# Patient Record
Sex: Female | Born: 1980 | Hispanic: No | Marital: Married | Smoking: Never smoker
Health system: Southern US, Community
[De-identification: ages and names within clinical notes are randomized; demographics above are authoritative.]

## PROBLEM LIST (undated history)

## (undated) DIAGNOSIS — R519 Headache, unspecified: Secondary | ICD-10-CM

## (undated) DIAGNOSIS — K635 Polyp of colon: Secondary | ICD-10-CM

## (undated) HISTORY — DX: Headache, unspecified: R51.9

## (undated) HISTORY — PX: TUBAL LIGATION: SHX77

## (undated) HISTORY — PX: DILATION AND CURETTAGE OF UTERUS: SHX78

## (undated) HISTORY — DX: Polyp of colon: K63.5

---
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
## 2005-04-22 DIAGNOSIS — C539 Malignant neoplasm of cervix uteri, unspecified: Secondary | ICD-10-CM

## 2005-04-22 HISTORY — DX: Malignant neoplasm of cervix uteri, unspecified: C53.9

## 2015-01-19 LAB — HIV ANTIBODY (ROUTINE TESTING W REFLEX): HIV 1&2 Ab, 4th Generation: NONREACTIVE

## 2015-12-20 LAB — HM PAP SMEAR: HM Pap smear: NEGATIVE

## 2019-11-29 ENCOUNTER — Telehealth: Payer: Self-pay

## 2019-11-29 NOTE — Telephone Encounter (Signed)
Copied from [place] [phone]. Topic: General - Other >> Nov 29, 2019  9:04 AM Keene Breath wrote: Reason for CRM: Patient is returning a call to reschedule her appt.  There was nothing available for over a year.  Patient understands but would like to be put on a schedule with a doctor for NP since it was not her fault that it was cancelled.  Please call to discuss [phone]

## 2019-11-30 ENCOUNTER — Ambulatory Visit: Payer: Self-pay | Admitting: Adult Health

## 2019-11-30 NOTE — Telephone Encounter (Signed)
Unable to reschedule appointment can we contact patient to reschedule?

## 2019-12-24 ENCOUNTER — Ambulatory Visit: Payer: Self-pay | Admitting: Adult Health

## 2020-01-07 ENCOUNTER — Ambulatory Visit: Payer: Self-pay | Admitting: Adult Health

## 2020-01-28 ENCOUNTER — Ambulatory Visit: Payer: Self-pay | Admitting: Adult Health

## 2020-02-09 ENCOUNTER — Encounter: Payer: Self-pay | Admitting: Family Medicine

## 2020-02-09 ENCOUNTER — Other Ambulatory Visit: Payer: Self-pay

## 2020-02-09 ENCOUNTER — Ambulatory Visit (INDEPENDENT_AMBULATORY_CARE_PROVIDER_SITE_OTHER): Payer: 59 | Admitting: Family Medicine

## 2020-02-09 ENCOUNTER — Telehealth: Payer: Self-pay | Admitting: Family Medicine

## 2020-02-09 VITALS — BP 98/72 | HR 68 | Temp 97.8°F | Ht 60.5 in | Wt 200.0 lb

## 2020-02-09 DIAGNOSIS — E782 Mixed hyperlipidemia: Secondary | ICD-10-CM

## 2020-02-09 DIAGNOSIS — E669 Obesity, unspecified: Secondary | ICD-10-CM

## 2020-02-09 DIAGNOSIS — Z1159 Encounter for screening for other viral diseases: Secondary | ICD-10-CM | POA: Diagnosis not present

## 2020-02-09 DIAGNOSIS — R41840 Attention and concentration deficit: Secondary | ICD-10-CM | POA: Diagnosis not present

## 2020-02-09 DIAGNOSIS — E785 Hyperlipidemia, unspecified: Secondary | ICD-10-CM | POA: Insufficient documentation

## 2020-02-09 LAB — CBC
HCT: 37.3 % (ref 36.0–46.0)
Hemoglobin: 12.4 g/dL (ref 12.0–15.0)
MCHC: 33.3 g/dL (ref 30.0–36.0)
MCV: 82.1 fl (ref 78.0–100.0)
Platelets: 367 10*3/uL (ref 150.0–400.0)
RBC: 4.54 Mil/uL (ref 3.87–5.11)
RDW: 14 % (ref 11.5–15.5)
WBC: 9.9 10*3/uL (ref 4.0–10.5)

## 2020-02-09 LAB — COMPREHENSIVE METABOLIC PANEL
ALT: 14 U/L (ref 0–35)
AST: 18 U/L (ref 0–37)
Albumin: 4.1 g/dL (ref 3.5–5.2)
Alkaline Phosphatase: 78 U/L (ref 39–117)
BUN: 11 mg/dL (ref 6–23)
CO2: 30 mEq/L (ref 19–32)
Calcium: 9.6 mg/dL (ref 8.4–10.5)
Chloride: 101 mEq/L (ref 96–112)
Creatinine, Ser: 0.77 mg/dL (ref 0.40–1.20)
GFR: 98.37 mL/min (ref >60.00)
Glucose, Bld: 82 mg/dL (ref 70–99)
Potassium: 4.2 mEq/L (ref 3.5–5.1)
Sodium: 135 mEq/L (ref 135–145)
Total Bilirubin: 0.5 mg/dL (ref 0.2–1.2)
Total Protein: 7.6 g/dL (ref 6.0–8.3)

## 2020-02-09 LAB — LIPID PANEL
Cholesterol: 161 mg/dL (ref 0–200)
HDL: 48.1 mg/dL (ref >39.00)
LDL Cholesterol: 80 mg/dL (ref 0–99)
NonHDL: 113.38
Total CHOL/HDL Ratio: 3
Triglycerides: 166 mg/dL — ABNORMAL HIGH (ref 0.0–149.0)
VLDL: 33.2 mg/dL (ref 0.0–40.0)

## 2020-02-09 NOTE — Assessment & Plan Note (Signed)
Working on lifestyle, recheck labs

## 2020-02-09 NOTE — Telephone Encounter (Signed)
Pt called stated that the referral for the [facility] in [place] and they are not available until June and wanted to know if she can get someone in [place]. Please advise.

## 2020-02-09 NOTE — Patient Instructions (Signed)
Strategies that help with ADHD  1) To-Do List - Make a daily list of tasks you want to accomplish - break large tasks down into smaller ones that you can do with your attention span - Categorize tasks using ABC system:  ---"A" tasks are of most importance and need to be done more immediately ---"B" tasks may be important, but can wait ---"C" tasks are often easiest, but should not be done first  2) Time Management: set a timer for completing certain tasks. Consider using this to monitor breaks between tasks  3) Delaying Distractibility: When trying to focus on a task, consider having a small notebook to jot down ideas that interrupt concentration. This way you don't forget something and can redirect attention to the immediate task  4) Mindfulness: Try to do only one thing at a time. Multitasking often leads to mistakes and distractions.   5) Minimize Distractions: Limit potential distractions while you are trying to complete tasks. Quiet place, no background noise, consider turning off your phone.   6) Visual Reminders: Use sticky notes, white board, or small notebook to remind yourself of tasks -- and keep in ONE obvious place  7) Reminders/alerts: Set timers or alerts to reminder yourself of appointments.   8) Calendar/planner: Keep a calendar to maintain your schedule as well as manage your time. Put all deadlines, appointments, etc in the planner and schedule blocks of time for projects at home (exercising, and other goals). Make "appointments" with that time to protect that time for those tasks specifically.   Self-help Materials 1) Resources:  -- Patent attorney but Scattered Guide to Success: How to Use Your Brains' Executive Skills to Keep Up, Stay Calm, and Get Organized at Work and at Home by Renato Battles and [facility]: Treatment Options and Coping Strategies for Adult Attention Deficit Disorder by Virgie Dad and Benjie Karvonen -- Driven to Distraction (Revised): Recognizing  and Coping with Attention Deficit Disorder by Alethia Berthold and Zigmund Gottron -- Taking Charge of Adult ADHD by Murlean Hark -- www.chadd.org and www.http://gonzalez-rivas.net/  2) Deep breathing - can be done in 3-5 minutes throughout the day.  -- Take a deep breath in through the nose (for 4 seconds), noticing yoru stomach expands as you inhale and take along breath out (for 6 seconds) noticing that your stomach deflates as you exhale. Apps like Calm and Breath2Relax provide short, guided breathing exercises.   3) Mindfulness: The goal is to concentrate on the present moment in a descriptive and non-judgmental manner -- Books: The Mindfulness Prescription for Adult ADHD: An 8-Step Program for Strengthening Your Attention, Managing Emotions, and Achieving Your Goals by Bari Edward -- Apps: Insight Timer, Headspace, and Buddhify2 -- Websites: www.mindful.org and franticworld.com/free-meditations-from-mindfulness and YouTube videos by Lynn Ito such as "Three Minute Breathing Space" -- Yoga practice   Healthy Habits 1) Try to maintain a healthy lifestyle -- eating lots of fruits and veggies and getting regular exercise  2) Good Sleep Hygiene Habits -- Got to bed and wake up within an hour of the same time every day -- Avoid bright screens (from laptop, phone, TV) within at least 30 minutes before bed. The "blue light" supresses the sleep hormone melatonin and the content may stimulate as well -- Avoid heavy foods and caffeine within at least 2 hours of bedtime -- Maintain a quiet and dark sleep environment (blackout curtains, turn on a fan or white noise to block out disruptive sounds) -- Practicing relaxing activites before bed (taking  a shower, reading a book, journaling, meditation app) -- To quiet a busy mid -- consider journaling before bed (jotting down reminders, worry thoughts, as well as positive things like a gratitude list)

## 2020-02-09 NOTE — Assessment & Plan Note (Signed)
Weight down 100 lbs with regular exercise and diet. Continue to work on healthy lifestyle and labs today to assess.

## 2020-02-09 NOTE — Assessment & Plan Note (Signed)
Discussed this could be related to anxiety/depression or normal variation given having lots of responsibility. Pt is interested in pursuing evaluation with psychology, referral placed. Also given handout of non-medication approaches.

## 2020-02-09 NOTE — Telephone Encounter (Signed)
Routing to referral coordinator to update referral location for [place]. Needs ADHD assessment.

## 2020-02-09 NOTE — Progress Notes (Signed)
Subjective:     Meghan Lewis is a 39 y.o. female presenting for Establish Care and ADHD (never been diagnosed, but thinks she has it )     HPI  #ADHD - taking ashwagandha w/ some improvement in mood - is getting irritable if she is talking and interrupted because she loses her train of thought and gets upset - husband notices that she is talking non-stop - difficulty keeping her focus  - will start one task and then go to another room and get distracted by something else - will start a kettle for tea and forget about it   Was initially having some worry/anxiety  Would worry about not getting tasks done  Will go down a rabbit whole of what could happen  Denies depression symptoms   Did not get good grades in school Had difficulty focusing in school -- middle and high were difficult  At last job was able to switch between tasks easily and did well in a busy office environment    Review of Systems   Social History   Tobacco Use  Smoking Status Never Smoker  Smokeless Tobacco Never Used        Objective:    BP Readings from Last 3 Encounters:  02/09/20 98/72   Wt Readings from Last 3 Encounters:  02/09/20 200 lb (90.7 kg)    BP 98/72   Pulse 68   Temp 97.8 F (36.6 C) (Temporal)   Ht 5' 0.5" (1.537 m)   Wt 200 lb (90.7 kg)   LMP 01/30/2020 (Exact Date)   SpO2 97%   BMI 38.42 kg/m    Physical Exam Constitutional:      General: She is not in acute distress.    Appearance: She is well-developed. She is not diaphoretic.  HENT:     Right Ear: External ear normal.     Left Ear: External ear normal.     Nose: Nose normal.  Eyes:     Conjunctiva/sclera: Conjunctivae normal.  Cardiovascular:     Rate and Rhythm: Normal rate.  Pulmonary:     Effort: Pulmonary effort is normal.  Musculoskeletal:     Cervical back: Neck supple.  Skin:    General: Skin is warm and dry.     Capillary Refill: Capillary refill takes less than 2 seconds.    Neurological:     Mental Status: She is alert. Mental status is at baseline.  Psychiatric:        Mood and Affect: Mood normal.        Behavior: Behavior normal.           Assessment & Plan:   Problem List Items Addressed This Visit      Other   Hyperlipidemia    Working on lifestyle, recheck labs      Relevant Orders   Lipid panel   Obesity (BMI 35.0-39.9 without comorbidity)    Weight down 100 lbs with regular exercise and diet. Continue to work on healthy lifestyle and labs today to assess.       Relevant Orders   Comprehensive metabolic panel   Lipid panel   CBC   Attention deficit - Primary    Discussed this could be related to anxiety/depression or normal variation given having lots of responsibility. Pt is interested in pursuing evaluation with psychology, referral placed. Also given handout of non-medication approaches.       Relevant Orders   Ambulatory referral to Psychology    Other Visit Diagnoses  Need for hepatitis C screening test       Relevant Orders   Hepatitis C antibody       Return in about 6 months (around 08/09/2020).  Lesleigh Noe, MD  This visit occurred during the SARS-CoV-2 public health emergency.  Safety protocols were in place, including screening questions prior to the visit, additional usage of staff PPE, and extensive cleaning of exam room while observing appropriate contact time as indicated for disinfecting solutions.

## 2020-02-10 LAB — HEPATITIS C ANTIBODY
Hepatitis C Ab: NONREACTIVE
SIGNAL TO CUT-OFF: 0.01 (ref <1.00)

## 2020-02-11 NOTE — Telephone Encounter (Signed)
Sent to [place] - placed on their work que and requested a call if unable to schedule the patient within in the next few weeks.

## 2020-02-24 NOTE — Telephone Encounter (Signed)
These are some options for the patient ... [facility] for patient to return call to discuss further   [facility] [phone]  [facility] [place] [facility] [phone]  [employer] [phone]  [employer] [phone]

## 2020-03-15 ENCOUNTER — Encounter: Payer: Self-pay | Admitting: Family Medicine

## 2020-04-19 ENCOUNTER — Encounter: Payer: Self-pay | Admitting: *Deleted

## 2020-04-19 NOTE — Telephone Encounter (Signed)
Letter mailed to contact the office

## 2020-08-09 ENCOUNTER — Ambulatory Visit (INDEPENDENT_AMBULATORY_CARE_PROVIDER_SITE_OTHER): Payer: 59 | Admitting: Family Medicine

## 2020-08-09 ENCOUNTER — Encounter: Payer: Self-pay | Admitting: Family Medicine

## 2020-08-09 ENCOUNTER — Other Ambulatory Visit: Payer: Self-pay

## 2020-08-09 VITALS — BP 130/80 | HR 89 | Temp 98.0°F | Ht 61.0 in | Wt 205.5 lb

## 2020-08-09 DIAGNOSIS — R41840 Attention and concentration deficit: Secondary | ICD-10-CM

## 2020-08-09 DIAGNOSIS — Z8541 Personal history of malignant neoplasm of cervix uteri: Secondary | ICD-10-CM | POA: Diagnosis not present

## 2020-08-09 NOTE — Progress Notes (Signed)
   Subjective:     Meghan Lewis is a 40 y.o. 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 job - thinks she was just depressed and this was causing her focus issues  - on vacation currently -    Review of Systems   Social History   Tobacco Use  Smoking Status Never Smoker  Smokeless Tobacco Never Used        Objective:    BP Readings from Last 3 Encounters:  08/09/20 130/80  02/09/20 98/72   Wt Readings from Last 3 Encounters:  08/09/20 205 lb 8 oz (93.2 kg)  02/09/20 200 lb (90.7 kg)    BP 130/80   Pulse 89   Temp 98 F (36.7 C) (Temporal)   Ht 5\' 1"  (1.549 m)   Wt 205 lb 8 oz (93.2 kg)   LMP 07/24/2020 (Exact Date)   SpO2 99%   BMI 38.83 kg/m    Physical Exam Constitutional:      General: She is not in acute distress.    Appearance: She is well-developed. She is not diaphoretic.  HENT:     Right Ear: External ear normal.     Left Ear: External ear normal.  Eyes:     Conjunctiva/sclera: Conjunctivae normal.  Cardiovascular:     Rate and Rhythm: Normal rate.  Pulmonary:     Effort: Pulmonary effort is normal.  Musculoskeletal:     Cervical back: Neck supple.  Skin:    General: Skin is warm and dry.     Capillary Refill: Capillary refill takes less than 2 seconds.  Neurological:     Mental Status: She is alert. Mental status is at baseline.  Psychiatric:        Mood and Affect: Mood normal.        Behavior: Behavior normal.           Assessment & Plan:   Problem List Items Addressed This Visit      Other   Attention deficit -  Primary    Symptoms resolved. Doing well at her job.       History of cervical cancer    Discussed need for continued routine screening. She will return in August for annual with pap.           Return in about 4 months (around 12/09/2020) for annual physical.  Lesleigh Noe, MD  This visit occurred during the SARS-CoV-2 public health emergency.  Safety protocols were in place, including screening questions prior to the visit, additional usage of staff PPE, and extensive cleaning of exam room while observing appropriate contact time as indicated for disinfecting solutions.

## 2020-08-09 NOTE — Assessment & Plan Note (Signed)
Symptoms resolved. Doing well at her job.

## 2020-08-09 NOTE — Patient Instructions (Signed)
YAY! Glad you are doing better  Return in 4 months for annual

## 2020-08-09 NOTE — Assessment & Plan Note (Signed)
Discussed need for continued routine screening. She will return in August for annual with pap.

## 2020-12-11 ENCOUNTER — Other Ambulatory Visit (HOSPITAL_COMMUNITY)
Admission: RE | Admit: 2020-12-11 | Discharge: 2020-12-11 | Disposition: A | Discharge status: Home / Self Care | Payer: 59 | Source: Ambulatory Visit | Attending: Family Medicine | Admitting: Family Medicine

## 2020-12-11 ENCOUNTER — Other Ambulatory Visit: Payer: Self-pay

## 2020-12-11 ENCOUNTER — Ambulatory Visit (INDEPENDENT_AMBULATORY_CARE_PROVIDER_SITE_OTHER): Payer: 59 | Admitting: Family Medicine

## 2020-12-11 ENCOUNTER — Encounter: Payer: Self-pay | Admitting: Family Medicine

## 2020-12-11 VITALS — BP 118/64 | HR 58 | Temp 98.6°F | Resp 15 | Ht 61.0 in | Wt 212.8 lb

## 2020-12-11 DIAGNOSIS — Z124 Encounter for screening for malignant neoplasm of cervix: Secondary | ICD-10-CM

## 2020-12-11 DIAGNOSIS — Z0000 Encounter for general adult medical examination without abnormal findings: Secondary | ICD-10-CM

## 2020-12-11 NOTE — Patient Instructions (Signed)
Preventive Care 21-39 Years Old, Female Preventive care refers to lifestyle choices and visits with your health care provider that can promote health and wellness. This includes: A yearly physical exam. This is also called an annual wellness visit. Regular dental and eye exams. Immunizations. Screening for certain conditions. Healthy lifestyle choices, such as: Eating a healthy diet. Getting regular exercise. Not using drugs or products that contain nicotine and tobacco. Limiting alcohol use. What can I expect for my preventive care visit? Physical exam Your health care provider may check your: Height and weight. These may be used to calculate your BMI (body mass index). BMI is a measurement that tells if you are at a healthy weight. Heart rate and blood pressure. Body temperature. Skin for abnormal spots. Counseling Your health care provider may ask you questions about your: Past medical problems. Family's medical history. Alcohol, tobacco, and drug use. Emotional well-being. Home life and relationship well-being. Sexual activity. Diet, exercise, and sleep habits. Work and work environment. Access to firearms. Method of birth control. Menstrual cycle. Pregnancy history. What immunizations do I need?  Vaccines are usually given at various ages, according to a schedule. Your health care provider will recommend vaccines for you based on your age, medicalhistory, and lifestyle or other factors, such as travel or where you work. What tests do I need?  Blood tests Lipid and cholesterol levels. These may be checked every 5 years starting at age 20. Hepatitis C test. Hepatitis B test. Screening Diabetes screening. This is done by checking your blood sugar (glucose) after you have not eaten for a while (fasting). STD (sexually transmitted disease) testing, if you are at risk. BRCA-related cancer screening. This may be done if you have a family history of breast, ovarian, tubal, or  peritoneal cancers. Pelvic exam and Pap test. This may be done every 3 years starting at age 21. Starting at age 30, this may be done every 5 years if you have a Pap test in combination with an HPV test. Talk with your health care provider about your test results, treatment options,and if necessary, the need for more tests. Follow these instructions at home: Eating and drinking  Eat a healthy diet that includes fresh fruits and vegetables, whole grains, lean protein, and low-fat dairy products. Take vitamin and mineral supplements as recommended by your health care provider. Do not drink alcohol if: Your health care provider tells you not to drink. You are pregnant, may be pregnant, or are planning to become pregnant. If you drink alcohol: Limit how much you have to 0-1 drink a day. Be aware of how much alcohol is in your drink. In the [place] one drink equals one 12 oz bottle of beer (355 mL), one 5 oz glass of wine (148 mL), or one 1 oz glass of hard liquor (44 mL).  Lifestyle Take daily care of your teeth and gums. Brush your teeth every morning and night with fluoride toothpaste. Floss one time each day. Stay active. Exercise for at least 30 minutes 5 or more days each week. Do not use any products that contain nicotine or tobacco, such as cigarettes, e-cigarettes, and chewing tobacco. If you need help quitting, ask your health care provider. Do not use drugs. If you are sexually active, practice safe sex. Use a condom or other form of protection to prevent STIs (sexually transmitted infections). If you do not wish to become pregnant, use a form of birth control. If you plan to become pregnant, see your health care   provider for a prepregnancy visit. Find healthy ways to cope with stress, such as: Meditation, yoga, or listening to music. Journaling. Talking to a trusted person. Spending time with friends and family. Safety Always wear your seat belt while driving or riding in a  vehicle. Do not drive: If you have been drinking alcohol. Do not ride with someone who has been drinking. When you are tired or distracted. While texting. Wear a helmet and other protective equipment during sports activities. If you have firearms in your house, make sure you follow all gun safety procedures. Seek help if you have been physically or sexually abused. What's next? Go to your health care provider once a year for an annual wellness visit. Ask your health care provider how often you should have your eyes and teeth checked. Stay up to date on all vaccines. This information is not intended to replace advice given to you by your health care provider. Make sure you discuss any questions you have with your healthcare provider. Document Revised: 12/05/2019 Document Reviewed: 12/18/2017 Elsevier Patient Education  2022 [employer].

## 2020-12-11 NOTE — Progress Notes (Signed)
Annual Exam   Chief Complaint:  Chief Complaint  Patient presents with   Annual Exam    Physical with a pap smear    History of Present Illness:  Ms. Meghan Lewis is a 40 y.o. No obstetric history on file. who LMP was No LMP recorded., presents today for her annual examination.      Nutrition/Lifestyle Diet: the same, healthy Exercise: run and walk on the treadmill, keeping up with a smart trainer She does get adequate calcium and Vitamin D in her diet.  Social History   Tobacco Use  Smoking Status Never  Smokeless Tobacco Never   Social History   Substance and Sexual Activity  Alcohol Use Not Currently   Social History   Substance and Sexual Activity  Drug Use Never     Safety The patient wears seatbelts: yes.     The patient feels safe at home and in their relationships: yes.  General Health Dentist in the last year: Yes Eye doctor: yes  Menstrual Regularly, no concerns  GYN She is single partner, contraception - tubal ligation.    Cervical Cancer Screening (Age 49-65) Last Pap:  August 2017 Results were: no abnormalities with HPV negative  Family History of Breast Cancer: no Family History of Ovarian Cancer: no    Weight Wt Readings from Last 3 Encounters:  12/11/20 212 lb 12.8 oz (96.5 kg)  08/09/20 205 lb 8 oz (93.2 kg)  02/09/20 200 lb (90.7 kg)   Patient has high BMI  BMI Readings from Last 1 Encounters:  12/11/20 40.21 kg/m     Chronic disease screening Blood pressure monitoring:  BP Readings from Last 3 Encounters:  12/11/20 118/64  08/09/20 130/80  02/09/20 98/72     Lipid Monitoring: Indication for screening: age >15, obesity, diabetes, family hx, CV risk factors.  Lipid screening: Yes  Lab Results  Component Value Date   CHOL 161 02/09/2020   HDL 48.10 02/09/2020   LDLCALC 80 02/09/2020   TRIG 166.0 (H) 02/09/2020   CHOLHDL 3 02/09/2020     Diabetes Screening: age >64, overweight, family hx, PCOS, hx of  gestational diabetes, at risk ethnicity, elevated blood pressure >135/80.  Diabetes Screening screening: Yes  No results found for: HGBA1C    Past Medical History:  Diagnosis Date   Cervical cancer ([facility]) 2007    Past Surgical History:  Procedure Laterality Date   CERVICAL BIOPSY  W/ LOOP ELECTRODE EXCISION  2000   CESAREAN SECTION     x3   DILATION AND CURETTAGE OF UTERUS     TUBAL LIGATION      Prior to Admission medications   Medication Sig Start Date End Date Taking? Authorizing Provider  ASHWAGANDHA PO Take 1 each by mouth in the morning and at bedtime.    [provider]  Creatine POWD Take 5 g by mouth daily. Mix with 24oz of water    [provider]  ECHINACEA PO Take 760 mg by mouth daily.    [provider]  Multiple Vitamins-Minerals (WOMENS MULTI VITAMIN & MINERAL PO) Take by mouth daily.    [provider]  Omega 3 1000 MG CAPS Take 1 capsule by mouth daily.    [provider]    Not on File  Gynecologic History: No LMP recorded.  Obstetric History: No obstetric history on file.  Social History   Socioeconomic History   Marital status: Married    Spouse name: Thurmond Butts   Number of children: 4  Years of education: High school   Highest education level: Not on file  Occupational History   Not on file  Tobacco Use   Smoking status: Never   Smokeless tobacco: Never  Vaping Use   Vaping Use: Never used  Substance and Sexual Activity   Alcohol use: Not Currently   Drug use: Never   Sexual activity: Yes    Birth control/protection: Surgical  Other Topics Concern   Not on file  Social History Narrative   02/09/20   From: [place] - husband with Nature conservation officer (retired now)    Living: with husband, Thurmond Butts (2010) - but together since ~2000   Work: working at [employer]: 4 children - Health and safety inspector (2010), [place] (2012), Zoila (2014), and [place] (2016), extended family in [place] and [place] - in-laws planning  to move to [place]       Enjoys: healthy living      Exercise: I-fit treadmill - interval training - hiking/jogging - 30-45 minutes daily   Diet: anabolic diet - high protein, low sugar/fat      Safety   Seat belts: Yes    Guns: Yes  and secure   Safe in relationships: Yes    Social Determinants of Radio broadcast assistant Strain: Not on file  Food Insecurity: Not on file  Transportation Needs: Not on file  Physical Activity: Not on file  Stress: Not on file  Social Connections: Not on file  Intimate Partner Violence: Not on file    Family History  Problem Relation Age of Onset   Stroke Mother 33   Stroke Father 64    Review of Systems  Constitutional:  Negative for chills and fever.  HENT:  Negative for congestion and sore throat.   Eyes:  Negative for blurred vision and double vision.  Respiratory:  Negative for shortness of breath.   Cardiovascular:  Negative for chest pain.  Gastrointestinal:  Negative for heartburn, nausea and vomiting.  Genitourinary: Negative.   Musculoskeletal: Negative.  Negative for myalgias.  Skin:  Negative for rash.  Neurological:  Negative for dizziness and headaches.  Endo/Heme/Allergies:  Does not bruise/bleed easily.  Psychiatric/Behavioral:  Negative for depression. The patient is not nervous/anxious.     Physical Exam BP 118/64 (BP Location: Left Arm, Patient Position: Sitting, Cuff Size: Large)   Pulse (!) 58   Temp 98.6 F (37 C) (Temporal)   Resp 15   Ht '5\' 1"'$  (1.549 m)   Wt 212 lb 12.8 oz (96.5 kg)   SpO2 98%   BMI 40.21 kg/m    BP Readings from Last 3 Encounters:  12/11/20 118/64  08/09/20 130/80  02/09/20 98/72    Wt Readings from Last 3 Encounters:  12/11/20 212 lb 12.8 oz (96.5 kg)  08/09/20 205 lb 8 oz (93.2 kg)  02/09/20 200 lb (90.7 kg)     Physical Exam Exam conducted with a chaperone present.  Constitutional:      General: She is not in acute distress.    Appearance: She is well-developed. She is  not diaphoretic.  HENT:     Head: Normocephalic and atraumatic.     Right Ear: External ear normal.     Left Ear: External ear normal.     Nose: Nose normal.  Eyes:     General: No scleral icterus.    Extraocular Movements: Extraocular movements intact.     Conjunctiva/sclera: Conjunctivae normal.  Cardiovascular:     Rate and Rhythm:  Normal rate and regular rhythm.     Heart sounds: No murmur heard. Pulmonary:     Effort: Pulmonary effort is normal. No respiratory distress.     Breath sounds: Normal breath sounds. No wheezing.  Abdominal:     General: Bowel sounds are normal. There is no distension.     Palpations: Abdomen is soft. There is no mass.     Tenderness: There is no abdominal tenderness. There is no guarding or rebound.  Genitourinary:    Vagina: Normal.     Cervix: No erythema.     Comments: Cervix with some scaring around 12 o'clock Musculoskeletal:        General: Normal range of motion.     Cervical back: Neck supple.  Lymphadenopathy:     Cervical: No cervical adenopathy.  Skin:    General: Skin is warm and dry.     Capillary Refill: Capillary refill takes less than 2 seconds.  Neurological:     Mental Status: She is alert and oriented to person, place, and time.     Deep Tendon Reflexes: Reflexes normal.  Psychiatric:        Mood and Affect: Mood normal.        Behavior: Behavior normal.      Results: Depression screen [facility] 2/9 02/09/2020  Decreased Interest 0  Down, Depressed, Hopeless 0  PHQ - 2 Score 0  Altered sleeping 1  Tired, decreased energy 0  Change in appetite 0  Feeling bad or failure about yourself  0  Trouble concentrating 1  Moving slowly or fidgety/restless 1  Suicidal thoughts 0  PHQ-9 Score 3      Assessment: 40 y.o. No obstetric history on file. female here for routine annual examination.  Plan: Problem List Items Addressed This Visit   None Visit Diagnoses     Annual physical exam    -  Primary   Screening for  cervical cancer       Relevant Orders   Cytology - PAP( [place])        Screening: -- Blood pressure screen normal -- cholesterol screening: not due for screening -- Weight screening: obese: discussed management options, including lifestyle, dietary, and exercise. -- Diabetes Screening: not due for screening -- Nutrition: encouraged healthy diet   Psych -- Depression screening (PHQ-9):  [place] Office Visit from 02/09/2020 in [place] at [facility]  PHQ-9 Total Score 3        Safety -- tobacco screening: not using -- alcohol screening:  low-risk usage. -- no evidence of domestic violence or intimate partner violence.   Cancer Screening -- pap smear collected per ASCCP guidelines -- family history of breast cancer screening: done. not at high risk.   Immunizations  There is no immunization history on file for this patient.  -- flu vaccine not up to date - not in season -- TDAP q10 years up to date - 2016 -- Covid-19 Vaccine not up to date - declined  Encouraged regular vision and dental screening. Encouraged healthy exercise and diet.   Lesleigh Noe

## 2020-12-12 LAB — CYTOLOGY - PAP
Comment: NEGATIVE
Diagnosis: NEGATIVE
High risk HPV: NEGATIVE

## 2021-01-22 ENCOUNTER — Emergency Department
Admission: EM | Admit: 2021-01-22 | Discharge: 2021-01-22 | Disposition: A | Discharge status: Home / Self Care | Payer: 59 | Attending: Emergency Medicine | Admitting: Emergency Medicine

## 2021-01-22 ENCOUNTER — Telehealth: Payer: Self-pay | Admitting: Family Medicine

## 2021-01-22 ENCOUNTER — Emergency Department: Payer: 59

## 2021-01-22 ENCOUNTER — Other Ambulatory Visit: Payer: Self-pay

## 2021-01-22 DIAGNOSIS — K625 Hemorrhage of anus and rectum: Secondary | ICD-10-CM

## 2021-01-22 DIAGNOSIS — R1031 Right lower quadrant pain: Secondary | ICD-10-CM | POA: Insufficient documentation

## 2021-01-22 DIAGNOSIS — Z8541 Personal history of malignant neoplasm of cervix uteri: Secondary | ICD-10-CM | POA: Diagnosis not present

## 2021-01-22 LAB — URINALYSIS, COMPLETE (UACMP) WITH MICROSCOPIC
Bacteria, UA: NONE SEEN
Bilirubin Urine: NEGATIVE
Glucose, UA: NEGATIVE mg/dL
Ketones, ur: NEGATIVE mg/dL
Leukocytes,Ua: NEGATIVE
Nitrite: NEGATIVE
Protein, ur: NEGATIVE mg/dL
Specific Gravity, Urine: 1.024 (ref 1.005–1.030)
WBC, UA: NONE SEEN WBC/hpf (ref 0–5)
pH: 5 (ref 5.0–8.0)

## 2021-01-22 LAB — COMPREHENSIVE METABOLIC PANEL
ALT: 20 U/L (ref 0–44)
AST: 19 U/L (ref 15–41)
Albumin: 3.9 g/dL (ref 3.5–5.0)
Alkaline Phosphatase: 64 U/L (ref 38–126)
Anion gap: 9 (ref 5–15)
BUN: 12 mg/dL (ref 6–20)
CO2: 25 mmol/L (ref 22–32)
Calcium: 8.5 mg/dL — ABNORMAL LOW (ref 8.9–10.3)
Chloride: 103 mmol/L (ref 98–111)
Creatinine, Ser: 0.53 mg/dL (ref 0.44–1.00)
GFR, Estimated: >60 mL/min (ref >60)
Glucose, Bld: 107 mg/dL — ABNORMAL HIGH (ref 70–99)
Potassium: 3.7 mmol/L (ref 3.5–5.1)
Sodium: 137 mmol/L (ref 135–145)
Total Bilirubin: 0.8 mg/dL (ref 0.3–1.2)
Total Protein: 7.7 g/dL (ref 6.5–8.1)

## 2021-01-22 LAB — CBC
HCT: 36 % (ref 36.0–46.0)
Hemoglobin: 12.1 g/dL (ref 12.0–15.0)
MCH: 28.3 pg (ref 26.0–34.0)
MCHC: 33.6 g/dL (ref 30.0–36.0)
MCV: 84.3 fL (ref 80.0–100.0)
Platelets: 385 10*3/uL (ref 150–400)
RBC: 4.27 MIL/uL (ref 3.87–5.11)
RDW: 12.4 % (ref 11.5–15.5)
WBC: 10.1 10*3/uL (ref 4.0–10.5)
nRBC: 0 % (ref 0.0–0.2)

## 2021-01-22 LAB — LIPASE, BLOOD: Lipase: 41 U/L (ref 11–51)

## 2021-01-22 LAB — POC URINE PREG, ED: Preg Test, Ur: NEGATIVE

## 2021-01-22 MED ORDER — IOHEXOL 350 MG/ML SOLN
80.0000 mL | Freq: Once | INTRAVENOUS | Status: AC | PRN
  Administered 2021-01-22: 80 mL via INTRAVENOUS
  Filled 2021-01-22: qty 80

## 2021-01-22 NOTE — Telephone Encounter (Signed)
[place] [facility] - Client TELEPHONE ADVICE RECORD [facility]urse Patient Name: Meghan Lewis CK Gender: Female DOB: 1981/04/19 Age: 40 Y 15 M 3 D Return Phone Number: [phone] (Primary), [phone] (Secondary) Address: [place]/ [place] [address] Client [place] [facility] - Client Client Site [place] - Day Physician Waunita Schooner- MD Contact Type Call Who Is Calling Patient / Member / Family / Caregiver Call Type Triage / Clinical Relationship To Patient Self Return Phone Number [phone] (Primary) Chief Complaint Blood In Stool Reason for Call Symptomatic / Request for [place] states she has blood in her stool. Translation No Nurse Assessment Nurse: [place], RN, Estill Bamberg Date/Time (Eastern Time): 01/22/2021 12:01:03 PM Confirm and document reason for call. If symptomatic, describe symptoms. ---caller states she has blood in stool. she thought maybe a fissure but when she kept wiped there were clots. happened today 30 mins ago Does the patient have any new or worsening symptoms? ---Yes Will a triage be completed? ---Yes Related visit to physician within the last 2 weeks? ---No Does the PT have any chronic conditions? (i.e. diabetes, asthma, this includes High risk factors for pregnancy, etc.) ---No Is the patient pregnant or possibly pregnant? (Ask all females between the ages of 36-55) ---No Is this a behavioral health or substance abuse call? ---No Guidelines Guideline Title Affirmed Question Affirmed Notes Nurse Date/Time (Eastern Time) Rectal Bleeding [1] MODERATE rectal bleeding (small blood clots, passing blood without stool, or toilet water turns red) AND [2] more than once a day Hum[place]et, RN, Estill Bamberg 01/22/2021 12:02:05 PM Disp. Time Eilene Ghazi Time) Disposition Final User 01/22/2021 12:03:57 PM Go to ED Now Yes Humfleet, RN,  Estill Bamberg PLEASE NOTE: All timestamps contained within this report are represented as [place] Standard Time. CONFIDENTIALTY NOTICE: This fax transmission is intended only for the addressee. It contains information that is legally privileged, confidential or otherwise protected from use or disclosure. If you are not the intended recipient, you are strictly prohibited from reviewing, disclosing, copying using or disseminating any of this information or taking any action in reliance on or regarding this information. If you have received this fax in error, please notify us immediately by telephone so that we can arrange for its return to [place]. Phone: [phone], Toll-Free: [phone], Fax: [phone] Page: 2 of 2 Call Id: [id number] [place] Disagree/Comply Comply Caller Understands Yes PreDisposition Go to ED Care Advice Given Per Guideline GO TO ED NOW: * You need to be seen in the Emergency Department. * Go to the ED at ___________ [place] now. Drive carefully. CARE ADVICE given per Rectal Bleeding (Adult) guideline. * Patient should not delay going to the emergency department. NOTE TO TRIAGER - DRIVING: Referrals [facility] - ED

## 2021-01-22 NOTE — Discharge Instructions (Signed)
Your blood counts were normal today.  Your CT scan did not show any acute abnormality.  Please follow-up with a gastroenterologist for further evaluation of your rectal bleeding.

## 2021-01-22 NOTE — ED Provider Notes (Signed)
[facility]  ____________________________________________   Event Date/Time   First MD Initiated Contact with Patient 01/22/21 1534     (approximate)  I have reviewed the triage vital signs and the nursing notes.   HISTORY  Chief Complaint Rectal Bleeding    HPI Meghan Lewis is a 40 y.o. female cervical cancer who presents with rectal bleeding and abdominal pain.  Symptoms started today.  Patient woke up and noticed bright red blood when she was wiping and mixed in with her stool.  She has associated right lower quadrant pain.  Abdominal pain is been intermittent.  She had 3 additional stools while at work with bright red blood mixed in with the stool and she also noticed some clotting.  Denies fevers or chills.  No history of similar.  No urinary symptoms.  Not on any blood thinners.         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 Authorizing Provider  ASHWAGANDHA PO Take 1 each by mouth in the morning and at bedtime.    [provider]  Creatine POWD Take 5 g by mouth daily. Mix with 24oz of water    [provider]  ECHINACEA PO Take 760 mg by mouth daily.    [provider]  Multiple Vitamins-Minerals (WOMENS MULTI VITAMIN & MINERAL PO) Take by mouth daily.    [provider]  Omega 3 1000 MG CAPS Take 1 capsule by mouth daily.    [provider]    Allergies Patient has no allergy information  on record.  Family History  Problem Relation Age of Onset   Stroke Mother 28   Stroke Father 48    Social History Social History   Tobacco Use   Smoking status: Never   Smokeless tobacco: Never  Vaping Use   Vaping Use: Never used  Substance Use Topics   Alcohol use: Not Currently   Drug use: Never    Review of Systems   Review of Systems  Constitutional:  Negative for chills and fever.  Gastrointestinal:  Positive for abdominal pain and blood in stool. Negative for constipation, nausea and vomiting.  Genitourinary:  Negative for dysuria and hematuria.  All other systems reviewed and are negative.  Physical Exam Updated Vital Signs BP 122/68 (BP Location: Right Arm)   Pulse 60   Temp 98 F (36.7 C)   Resp 17   Ht 5\' 1"  (1.549 m)   Wt 96.5 kg   SpO2 98%   BMI 40.20 kg/m   Physical Exam Vitals and nursing note reviewed.  Constitutional:      General: She is not in acute distress.    Appearance: Normal appearance.  HENT:     Head: Normocephalic and atraumatic.  Eyes:     General: No scleral icterus.    Conjunctiva/sclera: Conjunctivae normal.  Cardiovascular:     Rate and Rhythm: Normal  rate and regular rhythm.  Pulmonary:     Effort: Pulmonary effort is normal. No respiratory distress.     Breath sounds: No stridor.  Abdominal:     Palpations: Abdomen is soft.     Comments: Tenderness to palpation of the right lower quadrant without guarding  Genitourinary:    Comments: No external hemorrhoids or fissures, brown stool on rectal exam Musculoskeletal:        General: No deformity or signs of injury.     Cervical back: Normal range of motion.  Skin:    General: Skin is dry.     Coloration: Skin is not jaundiced or pale.  Neurological:     General: No focal deficit present.     Mental Status: She is alert and oriented to person, place, and time. Mental status is at baseline.  Psychiatric:        Mood and Affect: Mood normal.        Behavior: Behavior  normal.     LABS (all labs ordered are listed, but only abnormal results are displayed)  Labs Reviewed  COMPREHENSIVE METABOLIC PANEL - Abnormal; Notable for the following components:      Result Value   Glucose, Bld 107 (*)    Calcium 8.5 (*)    All other components within normal limits  URINALYSIS, COMPLETE (UACMP) WITH MICROSCOPIC - Abnormal; Notable for the following components:   Color, Urine YELLOW (*)    APPearance CLEAR (*)    Hgb urine dipstick MODERATE (*)    All other components within normal limits  LIPASE, BLOOD  CBC  POC URINE PREG, ED   ____________________________________________  EKG  N/a ____________________________________________  RADIOLOGY Almeta Monas, personally viewed and evaluated these images (plain radiographs) as part of my medical decision making, as well as reviewing the written report by the radiologist.  ED MD interpretation: I reviewed the CT abdomen pelvis which does not show any acute pathology, there is diverticulosis without diverticulitis    ____________________________________________   PROCEDURES  Procedure(s) performed (including Critical Care):  Procedures   ____________________________________________   INITIAL IMPRESSION / ASSESSMENT AND PLAN / ED COURSE     Patient is a 40 year old female presenting with right lower quadrant pain and rectal bleeding.  Symptoms started today.  Has had multiple episodes of bright red blood which she describes as being mixed in with the stool.  She her main complaint was not abdominal pain however on my exam she is tender in the right lower quadrant.  She has no blood or melena on rectal exam, no fissures or hemorrhoids.  Labs are all reassuring, normal hemoglobin no leukocytosis.  Given her abdominal exam will obtain a CT abdomen pelvis to rule out appendicitis.  Need to follow-up with GI for potential colonoscopy.  CT abdomen pelvis did not show any acute pathology.  Discussed  with the patient and follow-up with GI.  She is stable for discharge.  Clinical Course as of 01/22/21 1943  Mon Jan 22, 2021  1548 GFR, Estimated: >60 [KM]  1548 Urinalysis, Complete w Microscopic Urine, Random(!) [KM]  1915 IMPRESSION: Diverticulosis without diverticulitis.   Normal-appearing appendix.   Tiny nonobstructing left renal stone.   Fatty liver.     [KM]    Clinical Course User Index [KM] Rada Hay, MD     ____________________________________________   FINAL CLINICAL IMPRESSION(S) / ED DIAGNOSES  Final diagnoses:  Rectal bleeding     ED Discharge Orders     None  Note:  This document was prepared using Dragon voice recognition software and may include unintentional dictation errors.    Rada Hay, MD 01/22/21 [phone]

## 2021-01-22 NOTE — Telephone Encounter (Signed)
Routing to triage to call pt.   Difficulty reading access nurse note.to understand why she was referred to ER   If no lightheadedness, shortness of breath, fast heart rate or persisting and large volume of bleeding seems reasonable to plan for close office f/u

## 2021-01-22 NOTE — Telephone Encounter (Signed)
Noted, will review ER work-up CT scan has been ordered

## 2021-01-22 NOTE — Telephone Encounter (Signed)
Per chart review tab pt checked in to [facility] ED at 12:47 pm. Sending note to Dr Einar Pheasant

## 2021-01-22 NOTE — Telephone Encounter (Signed)
Do not see patient checked in ED yet.

## 2021-01-22 NOTE — ED Triage Notes (Signed)
Pt comes with c/o rectal bleeding that started today and now having belly pain. Pt denies any hx of this. Pt states bright red blood with dark clots.

## 2021-03-27 ENCOUNTER — Ambulatory Visit (INDEPENDENT_AMBULATORY_CARE_PROVIDER_SITE_OTHER): Payer: 59 | Admitting: Gastroenterology

## 2021-03-27 ENCOUNTER — Encounter: Payer: Self-pay | Admitting: Gastroenterology

## 2021-03-27 VITALS — BP 107/64 | HR 71 | Temp 98.2°F | Ht 61.0 in | Wt 223.0 lb

## 2021-03-27 DIAGNOSIS — K625 Hemorrhage of anus and rectum: Secondary | ICD-10-CM

## 2021-03-27 DIAGNOSIS — K760 Fatty (change of) liver, not elsewhere classified: Secondary | ICD-10-CM

## 2021-03-27 MED ORDER — NA SULFATE-K SULFATE-MG SULF 17.5-3.13-1.6 GM/177ML PO SOLN: 1.0000 | Freq: Once | ORAL | 0 refills | Status: AC

## 2021-03-27 NOTE — Progress Notes (Signed)
Meghan Lewis [address]  [place]  [place], [place] [address]  Main: [phone]  Fax: [phone]   Gastroenterology Consultation  Referring Provider:     Lesleigh Noe, MD Primary Care Physician:  Lesleigh Noe, MD Reason for Consultation:     Rectal bleeding        HPI:    Chief Complaint  Patient presents with   New Patient (Initial Visit)    Pt denies any blood in stools since ED... pt reports QD normal BM, denies N/V/D or abd/rectal pain    Meghan Lewis is a 40 y.o. y/o female referred for consultation & management  by Dr. Einar Pheasant, Jobe Marker, MD. patient went to the ER in October 2020 where she had 2-day episode of bright red Blood per rectum at that time.  Reports seeing blood clots initially as well.  No prior history of similar episodes and no further episodes since then.  Denies being constipated at that time or straining with stools.  Reports 1 bowel movement daily.  States has a stool in her bathroom that she puts her feet on when she has a bowel movement.  No prior colonoscopy.  No family history of colon cancer.  No abdominal pain, weight loss, nausea or vomiting.  Good appetite.  No dysphagia.  No heartburn.  Past Medical History:  Diagnosis Date   Cervical cancer ([place]) 2007    Past Surgical History:  Procedure Laterality Date   CERVICAL BIOPSY  W/ LOOP ELECTRODE EXCISION  2000   CESAREAN SECTION     x3   DILATION AND CURETTAGE OF UTERUS     TUBAL LIGATION      Prior to Admission medications   Medication Sig Start Date End Date Taking? Authorizing Provider  ASHWAGANDHA PO Take 1 each by mouth in the morning and at bedtime.    [provider]  Creatine POWD Take 5 g by mouth daily. Mix with 24oz of water    [provider]  ECHINACEA PO Take 760 mg by mouth daily.    [provider]  Multiple Vitamins-Minerals (WOMENS MULTI VITAMIN & MINERAL PO) Take by mouth daily.    [provider]  Omega 3 1000 MG  CAPS Take 1 capsule by mouth daily.    [provider]    Family History  Problem Relation Age of Onset   Stroke Mother 94   Stroke Father 38     Social History   Tobacco Use   Smoking status: Never   Smokeless tobacco: Never  Vaping Use   Vaping Use: Never used  Substance Use Topics   Alcohol use: Not Currently   Drug use: Never    Allergies as of 03/27/2021   (Not on File)    Review of Systems:    All systems reviewed and negative except where noted in HPI.   Physical Exam:  Constitutional: General:   Alert,  Well-developed, well-nourished, pleasant and cooperative in NAD BP 107/64   Pulse 71   Temp 98.2 F (36.8 C) (Oral)   Ht 5\' 1"  (1.549 m)   Wt 223 lb (101.2 kg)   BMI 42.14 kg/m   Eyes:  Sclera clear, no icterus.   Conjunctiva pink. PERRLA  Ears:  No scars, lesions or masses, Normal auditory acuity. Nose:  No deformity, discharge, or lesions. Mouth:  No deformity or lesions, oropharynx pink & moist.  Neck:  Supple; no masses or thyromegaly.  Respiratory: Normal respiratory effort, Normal  percussion  Gastrointestinal: Soft, non-tender and non-distended without masses, hepatosplenomegaly or hernias noted.  No guarding or rebound tenderness.     Rectal Exam (with chaperone [facility]): No perianal lesions, or external hemorrhoids seen.  No anal tears or fissures present.  Very small internal hemorrhoids seen, nonbleeding  Cardiac: No clubbing or edema.  No cyanosis. Normal posterior tibial pedal pulses noted.  Lymphatic:  No significant cervical or axillary adenopathy.  Psych:  Alert and cooperative. Normal mood and affect.  Musculoskeletal:  Normal gait. Head normocephalic, atraumatic. Symmetrical without gross deformities. 5/5 Upper and Lower extremity strength bilaterally.  Skin: Warm. Intact without significant lesions or rashes. No jaundice.  Neurologic:  Face symmetrical, tongue midline, Normal sensation to touch;  grossly  normal neurologically.  Psych:  Alert and oriented x3, Alert and cooperative. Normal mood and affect.   Labs: CBC    Component Value Date/Time   WBC 10.1 01/22/2021 1324   RBC 4.27 01/22/2021 1324   HGB 12.1 01/22/2021 1324   HCT 36.0 01/22/2021 1324   PLT 385 01/22/2021 1324   MCV 84.3 01/22/2021 1324   MCH 28.3 01/22/2021 1324   MCHC 33.6 01/22/2021 1324   RDW 12.4 01/22/2021 1324   CMP     Component Value Date/Time   NA 137 01/22/2021 1324   K 3.7 01/22/2021 1324   CL 103 01/22/2021 1324   CO2 25 01/22/2021 1324   GLUCOSE 107 (H) 01/22/2021 1324   BUN 12 01/22/2021 1324   CREATININE 0.53 01/22/2021 1324   CALCIUM 8.5 (L) 01/22/2021 1324   PROT 7.7 01/22/2021 1324   ALBUMIN 3.9 01/22/2021 1324   AST 19 01/22/2021 1324   ALT 20 01/22/2021 1324   ALKPHOS 64 01/22/2021 1324   BILITOT 0.8 01/22/2021 1324   GFRNONAA >60 01/22/2021 1324    Imaging Studies: IMPRESSION: Diverticulosis without diverticulitis.   Normal-appearing appendix.   Tiny nonobstructing left renal stone.   Fatty liver.     Electronically Signed   By: Inez Catalina M.D.   On: 01/22/2021 19:07  Assessment and Plan:   Meghan Lewis is a 40 y.o. y/o female has been referred for rectal bleeding in October 2022  Source of hemorrhoids in October 22 likely were internal hemorrhoids We discussed colonoscopy to rule out any other underlying lesion such as malignancy versus conservative management.  Patient agreeable to colonoscopy but would like to schedule it in February 2022  We also discussed steroid suppositories for the very small internal hemorrhoids possibly present, but patient refuses as she states that she has not had any further episodes and would like to try and eat a higher fiber diet and if symptoms recur, she states she will reconsider this and is willing to move her colonoscopy to a sooner date  She states she will call us if symptoms recur  Follow-up in clinic in 2 to 3  months  High-fiber diet handout given  Finding of fatty liver on imaging discussed with patient Diet, weight loss, and exercise encouraged along with avoiding hepatotoxic drugs including alcohol Risk of progression to cirrhosis if above measures are not instituted were discussed as well, and patient verbalized understanding  Lab work ordered for fatty liver work-up as well  I have discussed alternative options, risks & benefits,  which include, but are not limited to, bleeding, infection, perforation,respiratory complication & drug reaction.  The patient agrees with this plan & written consent will be obtained.    Labs otherwise reassuring with normal CBC.  CT scan showed diverticulosis without diverticulitis.  This finding was discussed with patient as well and high-fiber diet again encouraged.  Patient encouraged to start a fiber supplement if not having 1-2 soft bowel movements a day or use MiraLAX or Metamucil to achieve this goal.  Hep C antibody negative in October 2021  Dr Meghan Lewis  Speech recognition software was used to dictate the above note.

## 2021-03-28 LAB — IRON AND TIBC
Iron Saturation: 20 % (ref 15–55)
Iron: 67 ug/dL (ref 27–159)
Total Iron Binding Capacity: 336 ug/dL (ref 250–450)
UIBC: 269 ug/dL (ref 131–425)

## 2021-03-30 LAB — ANA: ANA Titer 1: NEGATIVE

## 2021-03-30 LAB — CERULOPLASMIN: Ceruloplasmin: 28.5 mg/dL (ref 19.0–39.0)

## 2021-03-30 LAB — MITOCHONDRIAL/SMOOTH MUSCLE AB PNL
Mitochondrial Ab: <20 Units (ref 0.0–20.0)
Smooth Muscle Ab: 4 Units (ref 0–19)

## 2021-03-30 LAB — FERRITIN: Ferritin: 17 ng/mL (ref 15–150)

## 2021-03-30 LAB — HEPATITIS B SURFACE ANTIBODY,QUALITATIVE: Hep B Surface Ab, Qual: NONREACTIVE

## 2021-03-30 LAB — ANTI-MICROSOMAL ANTIBODY LIVER / KIDNEY: LKM1 Ab: 0.7 Units (ref 0.0–20.0)

## 2021-03-30 LAB — HEPATITIS C ANTIBODY: Hep C Virus Ab: <0.1 s/co ratio (ref 0.0–0.9)

## 2021-03-30 LAB — IGG: IgG (Immunoglobin G), Serum: 1513 mg/dL (ref 586–1602)

## 2021-03-30 LAB — HEPATITIS B SURFACE ANTIGEN: Hepatitis B Surface Ag: NEGATIVE

## 2021-03-30 LAB — HEPATITIS A ANTIBODY, TOTAL: hep A Total Ab: NEGATIVE

## 2021-03-30 LAB — HEPATITIS B CORE ANTIBODY, TOTAL: Hep B Core Total Ab: NEGATIVE

## 2021-05-31 ENCOUNTER — Ambulatory Visit
Admission: RE | Admit: 2021-05-31 | Discharge: 2021-05-31 | Disposition: A | Discharge status: Home / Self Care | Payer: 59 | Source: Ambulatory Visit | Attending: Gastroenterology | Admitting: Gastroenterology

## 2021-05-31 ENCOUNTER — Encounter: Payer: Self-pay | Admitting: Gastroenterology

## 2021-05-31 ENCOUNTER — Ambulatory Visit: Payer: 59 | Admitting: Certified Registered"

## 2021-05-31 ENCOUNTER — Other Ambulatory Visit: Payer: Self-pay

## 2021-05-31 ENCOUNTER — Encounter
Admission: RE | Disposition: A | Discharge status: Home / Self Care | Payer: Self-pay | Source: Ambulatory Visit | Attending: Gastroenterology

## 2021-05-31 DIAGNOSIS — K635 Polyp of colon: Secondary | ICD-10-CM

## 2021-05-31 DIAGNOSIS — K625 Hemorrhage of anus and rectum: Secondary | ICD-10-CM | POA: Insufficient documentation

## 2021-05-31 DIAGNOSIS — K5730 Diverticulosis of large intestine without perforation or abscess without bleeding: Secondary | ICD-10-CM | POA: Insufficient documentation

## 2021-05-31 HISTORY — PX: COLONOSCOPY WITH PROPOFOL: SHX5780

## 2021-05-31 LAB — POCT PREGNANCY, URINE: Preg Test, Ur: NEGATIVE

## 2021-05-31 SURGERY — COLONOSCOPY WITH PROPOFOL
Anesthesia: General

## 2021-05-31 MED ORDER — PROPOFOL 10 MG/ML IV BOLUS
INTRAVENOUS | Status: DC | PRN
  Administered 2021-05-31: 50 mg via INTRAVENOUS

## 2021-05-31 MED ORDER — PROPOFOL 500 MG/50ML IV EMUL
INTRAVENOUS | Status: AC
  Filled 2021-05-31: qty 50

## 2021-05-31 MED ORDER — PROPOFOL 500 MG/50ML IV EMUL
INTRAVENOUS | Status: DC | PRN
  Administered 2021-05-31: 150 ug/kg/min via INTRAVENOUS

## 2021-05-31 MED ORDER — SODIUM CHLORIDE 0.9 % IV SOLN: INTRAVENOUS | Status: DC

## 2021-05-31 MED ORDER — DEXMEDETOMIDINE (PRECEDEX) IN NS 20 MCG/5ML (4 MCG/ML) IV SYRINGE
PREFILLED_SYRINGE | INTRAVENOUS | Status: DC | PRN
  Administered 2021-05-31: 8 ug via INTRAVENOUS

## 2021-05-31 NOTE — Anesthesia Procedure Notes (Signed)
Procedure Name: MAC Date/Time: 05/31/2021 7:54 AM Performed by: Jerrye Noble, CRNA Pre-anesthesia Checklist: Patient identified, Emergency Drugs available, Suction available and Patient being monitored Patient Re-evaluated:Patient Re-evaluated prior to induction Oxygen Delivery Method: Nasal cannula

## 2021-05-31 NOTE — Op Note (Signed)
[facility] Gastroenterology Patient Name: Meghan Lewis Procedure Date: 05/31/2021 7:11 AM MRN: [id number] Account #: [id number] Date of Birth: December 22, 1980 Admit Type: Outpatient Age: 41 Room: [facility] ENDO ROOM 4 Gender: Female Note Status: Finalized Instrument Name: Colonoscope [id number] Procedure:             Colonoscopy Indications:           This is the patient's first colonoscopy, Rectal                         bleeding Providers:             Lin Landsman MD, MD Referring MD:          Jobe Marker. Einar Pheasant (Referring MD) Medicines:             General Anesthesia Complications:         No immediate complications. Estimated blood loss: None. Procedure:             Pre-Anesthesia Assessment:                        - Prior to the procedure, a History and Physical was                         performed, and patient medications and allergies were                         reviewed. The patient is competent. The risks and                         benefits of the procedure and the sedation options and                         risks were discussed with the patient. All questions                         were answered and informed consent was obtained.                         Patient identification and proposed procedure were                         verified by the physician, the nurse, the                         anesthesiologist, the anesthetist and the technician                         in the pre-procedure area in the procedure room in the                         endoscopy suite. Mental Status Examination: alert and                         oriented. Airway Examination: normal oropharyngeal                         airway and neck mobility. Respiratory Examination:  clear to auscultation. CV Examination: normal.                         Prophylactic Antibiotics: The patient does not require                         prophylactic antibiotics. Prior  Anticoagulants: The                         patient has taken no previous anticoagulant or                         antiplatelet agents. ASA Grade Assessment: II - A                         patient with mild systemic disease. After reviewing                         the risks and benefits, the patient was deemed in                         satisfactory condition to undergo the procedure. The                         anesthesia plan was to use general anesthesia.                         Immediately prior to administration of medications,                         the patient was re-assessed for adequacy to receive                         sedatives. The heart rate, respiratory rate, oxygen                         saturations, blood pressure, adequacy of pulmonary                         ventilation, and response to care were monitored                         throughout the procedure. The physical status of the                         patient was re-assessed after the procedure.                        After obtaining informed consent, the colonoscope was                         passed under direct vision. Throughout the procedure,                         the patient's blood pressure, pulse, and oxygen                         saturations were monitored continuously. The  Colonoscope was introduced through the anus and                         advanced to the the cecum, identified by appendiceal                         orifice and ileocecal valve. The colonoscopy was                         performed without difficulty. The patient tolerated                         the procedure well. The quality of the bowel                         preparation was evaluated using the BBPS [facility] Bowel                         Preparation Scale) with scores of: Right Colon = 3,                         Transverse Colon = 3 and Left Colon = 3 (entire mucosa                         seen well with no  residual staining, small fragments                         of stool or opaque liquid). The total BBPS score                         equals 9. Findings:      The perianal and digital rectal examinations were normal. Pertinent       negatives include normal sphincter tone and no palpable rectal lesions.      A diminutive polyp was found in the cecum. The polyp was sessile. The       polyp was removed with a jumbo cold forceps. Resection and retrieval       were complete.      A 5 mm polyp was found in the sigmoid colon. The polyp was sessile. The       polyp was removed with a cold snare. Resection and retrieval were       complete.      Multiple diverticula were found in the left colon and right colon.      The retroflexed view of the distal rectum and anal verge was normal and       showed no anal or rectal abnormalities. Impression:            - One diminutive polyp in the cecum, removed with a                         jumbo cold forceps. Resected and retrieved.                        - One 5 mm polyp in the sigmoid colon, removed with a                         cold  snare. Resected and retrieved.                        - Diverticulosis in the left colon and in the right                         colon.                        - The distal rectum and anal verge are normal on                         retroflexion view. Recommendation:        - Discharge patient to home (with escort).                        - Resume previous diet today.                        - Continue present medications.                        - Await pathology results.                        - Repeat colonoscopy in 7-10 years for surveillance                         based on pathology results. Procedure Code(s):     --- Professional ---                        [phone], Colonoscopy, flexible; with removal of                         tumor(s), polyp(s), or other lesion(s) by snare                         technique                         [id number], 75, Colonoscopy, flexible; with biopsy, single                         or multiple Diagnosis Code(s):     --- Professional ---                        K63.5, Polyp of colon                        K62.5, Hemorrhage of anus and rectum                        K57.30, Diverticulosis of large intestine without                         perforation or abscess without bleeding CPT copyright 2019 American Medical Association. All rights reserved. The codes documented in this report are preliminary and upon coder review may  be revised to meet current compliance requirements. Dr. Ulyess Mort Lin Landsman MD, MD 05/31/2021 8:08:26 AM This report has been signed electronically. Number of  Addenda: 0 Note Initiated On: 05/31/2021 7:11 AM Scope Withdrawal Time: 0 hours 6 minutes 26 seconds  Total Procedure Duration: 0 hours 10 minutes 13 seconds  Estimated Blood Loss:  Estimated blood loss: none.      [facility]

## 2021-05-31 NOTE — Anesthesia Preprocedure Evaluation (Signed)
Anesthesia Evaluation  Patient identified by MRN, date of birth, ID band Patient awake    Reviewed: Allergy & Precautions, NPO status , Patient's Chart, lab work & pertinent test results  History of Anesthesia Complications Negative for: history of anesthetic complications  Airway Mallampati: III  TM Distance: <3 FB Neck ROM: full    Dental  (+) Chipped   Pulmonary neg pulmonary ROS, neg shortness of breath,    Pulmonary exam normal        Cardiovascular Exercise Tolerance: Good (-) angina(-) Past MI negative cardio ROS Normal cardiovascular exam     Neuro/Psych negative neurological ROS  negative psych ROS   GI/Hepatic negative GI ROS, Neg liver ROS, neg GERD  ,  Endo/Other  negative endocrine ROS  Renal/GU negative Renal ROS  negative genitourinary   Musculoskeletal   Abdominal   Peds  Hematology negative hematology ROS (+)   Anesthesia Other Findings Past Medical History: 2007: Cervical cancer ([place])  Past Surgical History: 2000: CERVICAL BIOPSY  W/ LOOP ELECTRODE EXCISION No date: CESAREAN SECTION     Comment:  x3 No date: DILATION AND CURETTAGE OF UTERUS No date: TUBAL LIGATION  BMI    Body Mass Index: 41.21 kg/m      Reproductive/Obstetrics negative OB ROS                             Anesthesia Physical Anesthesia Plan  ASA: 2  Anesthesia Plan: General   Post-op Pain Management:    Induction: Intravenous  PONV Risk Score and Plan: Propofol infusion and TIVA  Airway Management Planned: Natural Airway and Nasal Cannula  Additional Equipment:   Intra-op Plan:   Post-operative Plan:   Informed Consent: I have reviewed the patients History and Physical, chart, labs and discussed the procedure including the risks, benefits and alternatives for the proposed anesthesia with the patient or authorized representative who has indicated his/her understanding and  acceptance.     Dental Advisory Given  Plan Discussed with: Anesthesiologist, CRNA and Surgeon  Anesthesia Plan Comments: (Patient consented for risks of anesthesia including but not limited to:  - adverse reactions to medications - risk of airway placement if required - damage to eyes, teeth, lips or other oral mucosa - nerve damage due to positioning  - sore throat or hoarseness - Damage to heart, brain, nerves, lungs, other parts of body or loss of life  Patient voiced understanding.)        Anesthesia Quick Evaluation

## 2021-05-31 NOTE — H&P (Signed)
Cephas Darby, MD [address]  [place]  [place], [place] [address]  Main: [phone]  Fax: [phone] Pager: [phone]  Primary Care Physician:  Lesleigh Noe, MD Primary Gastroenterologist:  Dr. Cephas Darby  Pre-Procedure History & Physical: HPI:  Meghan Lewis is a 41 y.o. female is here for an colonoscopy.   Past Medical History:  Diagnosis Date   Cervical cancer ([place]) 2007    Past Surgical History:  Procedure Laterality Date   CERVICAL BIOPSY  W/ LOOP ELECTRODE EXCISION  2000   CESAREAN SECTION     x3   DILATION AND CURETTAGE OF UTERUS     TUBAL LIGATION      Prior to Admission medications   Medication Sig Start Date End Date Taking? Authorizing Provider  ASHWAGANDHA PO Take 1 each by mouth in the morning and at bedtime.    [provider]  Creatine POWD Take 5 g by mouth daily. Mix with 24oz of water    [provider]  ECHINACEA PO Take 760 mg by mouth daily.    [provider]  Multiple Vitamins-Minerals (WOMENS MULTI VITAMIN & MINERAL PO) Take by mouth daily.    [provider]  Omega 3 1000 MG CAPS Take 1 capsule by mouth daily.    [provider]    Allergies as of 03/28/2021   (Not on File)    Family History  Problem Relation Age of Onset   Stroke Mother 8   Stroke Father 92    Social History   Socioeconomic History   Marital status: Married    Spouse name: Thurmond Butts   Number of children: 4   Years of education: High school   Highest education level: Not on file  Occupational History   Not on file  Tobacco Use   Smoking status: Never   Smokeless tobacco: Never  Vaping Use   Vaping Use: Never used  Substance and Sexual Activity   Alcohol use: Not Currently   Drug use: Never   Sexual activity: Yes    Birth control/protection: Surgical  Other Topics Concern   Not on file  Social History Narrative   02/09/20   From: [place] - husband with Nature conservation officer (retired now)    Living:  with husband, Thurmond Butts (2010) - but together since ~2000   Work: working at [employer]: 4 children - Health and safety inspector (2010), [place] (2012), [place] (2014), and [place] (2016), extended family in [place] and [place] - in-laws planning to move to [place]       Enjoys: healthy living      Exercise: I-fit treadmill - interval training - hiking/jogging - 30-45 minutes daily   Diet: anabolic diet - high protein, low sugar/fat      Safety   Seat belts: Yes    Guns: Yes  and secure   Safe in relationships: Yes    Social Determinants of Radio broadcast assistant Strain: Not on file  Food Insecurity: Not on file  Transportation Needs: Not on file  Physical Activity: Not on file  Stress: Not on file  Social Connections: Not on file  Intimate Partner Violence: Not on file    Review of Systems: See HPI, otherwise negative ROS  Physical Exam: BP 127/86    Pulse 76    Temp (!) 97 F (36.1 C) (Temporal)    Resp 18    Ht 5' (1.524 m)    Wt 95.7 kg  SpO2 99%    BMI 41.21 kg/m  General:   Alert,  pleasant and cooperative in NAD Head:  Normocephalic and atraumatic. Neck:  Supple; no masses or thyromegaly. Lungs:  Clear throughout to auscultation.    Heart:  Regular rate and rhythm. Abdomen:  Soft, nontender and nondistended. Normal bowel sounds, without guarding, and without rebound.   Neurologic:  Alert and  oriented x4;  grossly normal neurologically.  Impression/Plan: Meghan Lewis is here for an colonoscopy to be performed for rectal bleeding  Risks, benefits, limitations, and alternatives regarding  colonoscopy have been reviewed with the patient.  Questions have been answered.  All parties agreeable.   Sherri Sear, MD  05/31/2021, 7:50 AM

## 2021-05-31 NOTE — Anesthesia Postprocedure Evaluation (Signed)
Anesthesia Post Note  Patient: KALEEN ROCHETTE  Procedure(s) Performed: COLONOSCOPY WITH PROPOFOL  Patient location during evaluation: Endoscopy Anesthesia Type: General Level of consciousness: awake and alert Pain management: pain level controlled Vital Signs Assessment: post-procedure vital signs reviewed and stable Respiratory status: spontaneous breathing, nonlabored ventilation, respiratory function stable and patient connected to nasal cannula oxygen Cardiovascular status: blood pressure returned to baseline and stable Postop Assessment: no apparent nausea or vomiting Anesthetic complications: no   No notable events documented.   Last Vitals:  Vitals:   05/31/21 0820 05/31/21 0830  BP: 94/64 102/61  Pulse: 70 65  Resp: 17 17  Temp:    SpO2: 98% 98%    Last Pain:  Vitals:   05/31/21 0830  TempSrc:   PainSc: 0-No pain                 Precious Haws Liliah Dorian

## 2021-05-31 NOTE — Transfer of Care (Signed)
Immediate Anesthesia Transfer of Care Note  Patient: Meghan Lewis  Procedure(s) Performed: COLONOSCOPY WITH PROPOFOL  Patient Location: PACU  Anesthesia Type:General  Level of Consciousness: drowsy and patient cooperative  Airway & Oxygen Therapy: Patient Spontanous Breathing  Post-op Assessment: Report given to RN and Post -op Vital signs reviewed and stable  Post vital signs: Reviewed and stable  Last Vitals:  Vitals Value Taken Time  BP 101/50 05/31/21 0811  Temp    Pulse 70 05/31/21 0810  Resp 25 05/31/21 0810  SpO2 97 % 05/31/21 0810  Vitals shown include unvalidated device data.  Last Pain:  Vitals:   05/31/21 0701  TempSrc: Temporal  PainSc: 0-No pain         Complications: No notable events documented.

## 2021-06-01 ENCOUNTER — Encounter: Payer: Self-pay | Admitting: Gastroenterology

## 2021-06-01 LAB — SURGICAL PATHOLOGY

## 2022-09-05 IMAGING — CT CT ABD-PELV W/ CM
2 of 4 series · 16 of 46 positions shown, 18 images · IV contrast (APPLIED)
Comparison: None

CLINICAL DATA: Right lower quadrant pain

EXAM:
CT ABDOMEN AND PELVIS WITH CONTRAST
TECHNIQUE: Multidetector CT imaging of the abdomen and pelvis was performed
using the standard protocol following bolus administration of
intravenous contrast.
CONTRAST:  80mL OMNIPAQUE IOHEXOL 350 MG/ML SOLN

[Series 2: axial st · axial · 0.74mm/px · z∈[-810,-390]mm · 13 of 92 slices shown, 15 images]
[im 4/92  soft-tissue]
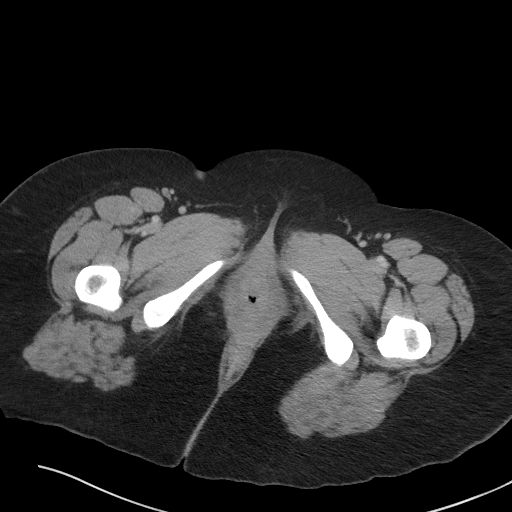
[im 4/92  bone]
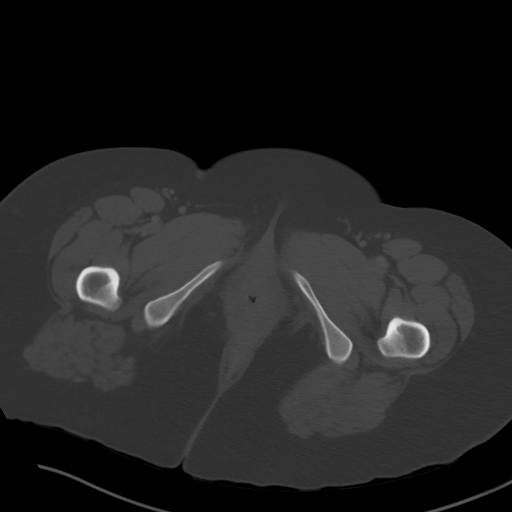
[im 12/92  soft-tissue]
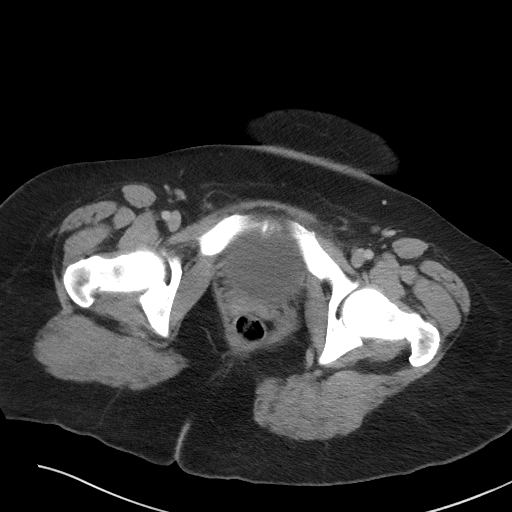
[im 19/92  soft-tissue]
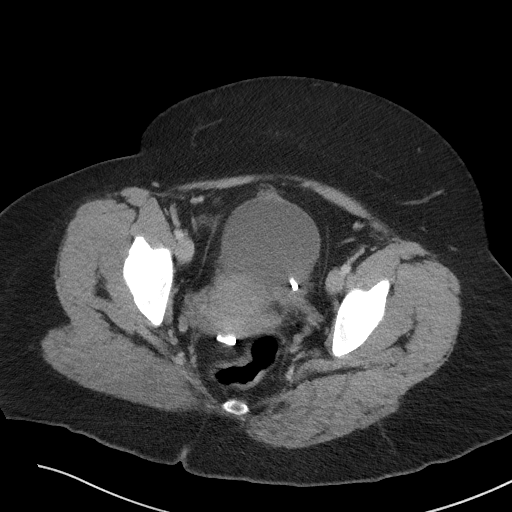
[im 27/92  soft-tissue]
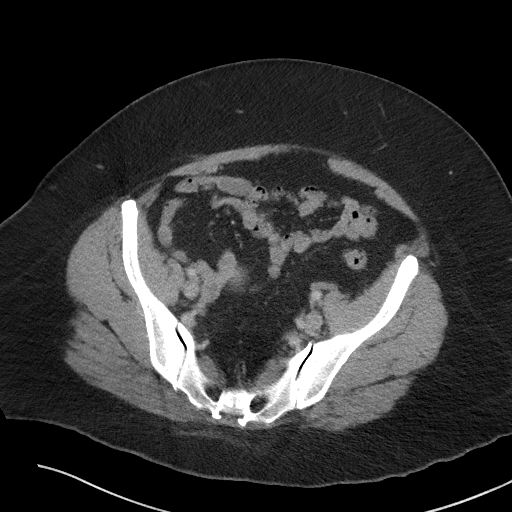
[im 31/92  soft-tissue]
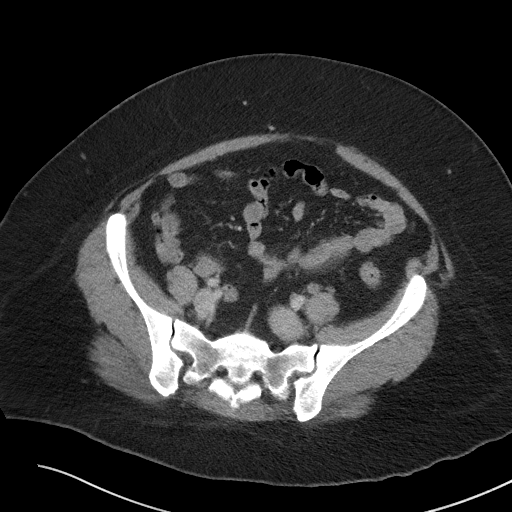
[im 38/92  soft-tissue]
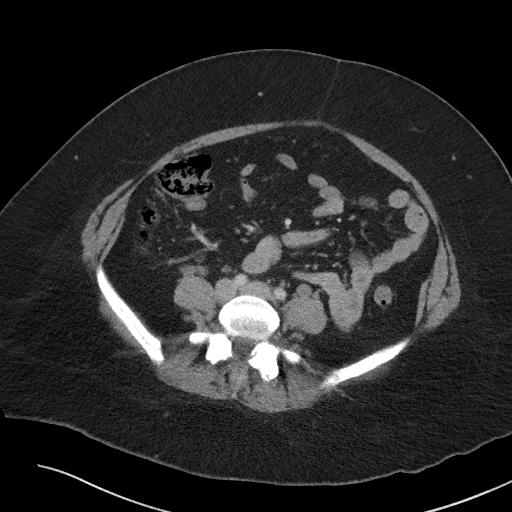
[im 46/92  soft-tissue]
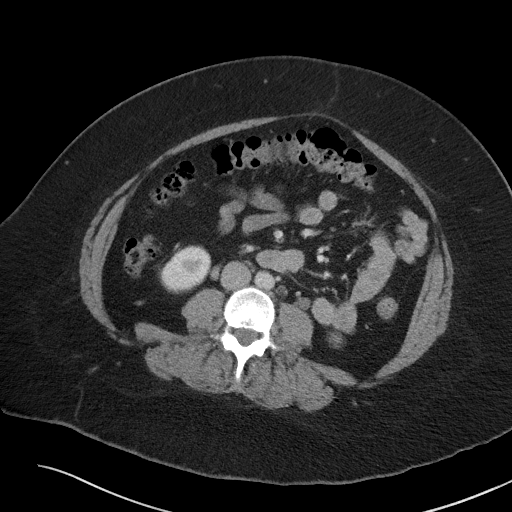
[im 54/92  soft-tissue]
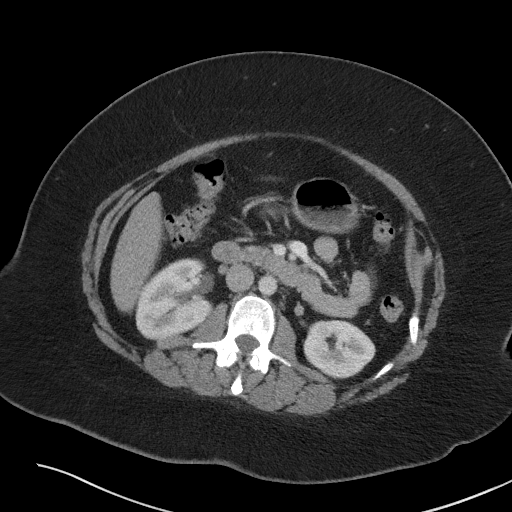
[im 61/92  soft-tissue]
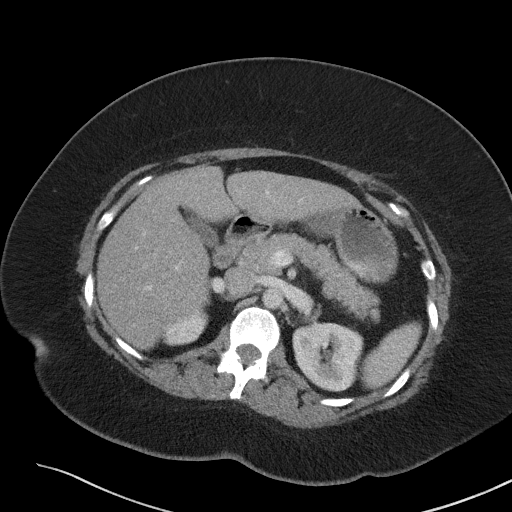
[im 61/92  bone]
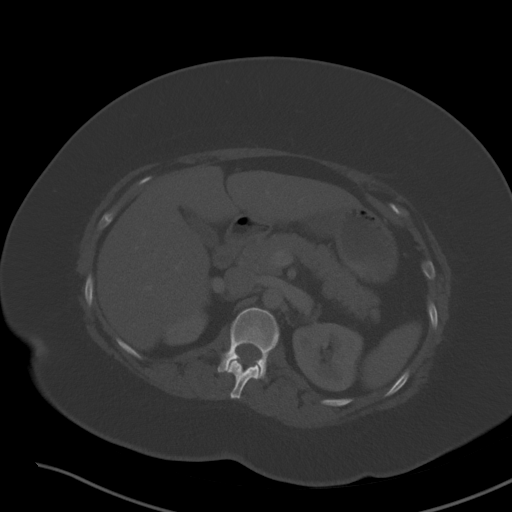
[im 65/92  soft-tissue]
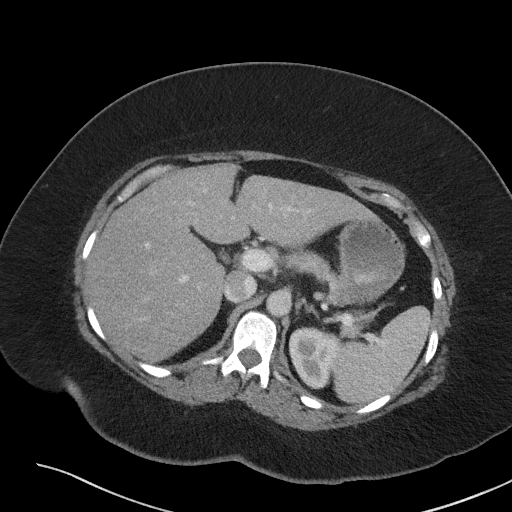
[im 73/92  soft-tissue]
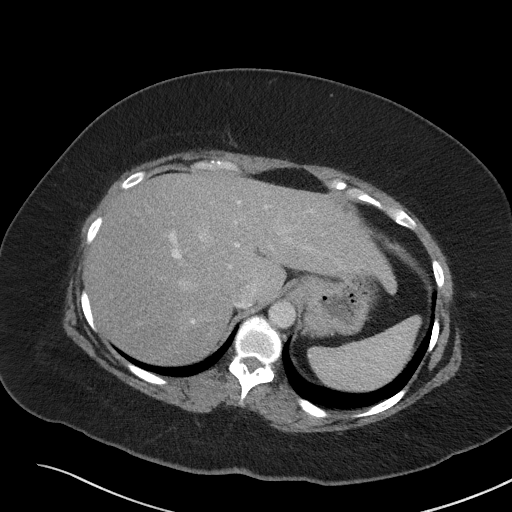
[im 80/92  soft-tissue]
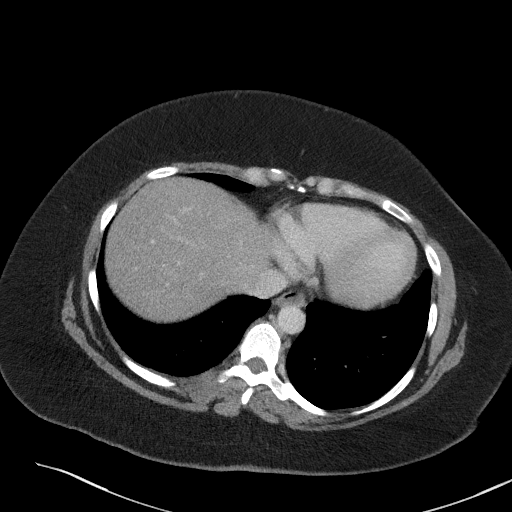
[im 88/92  soft-tissue]
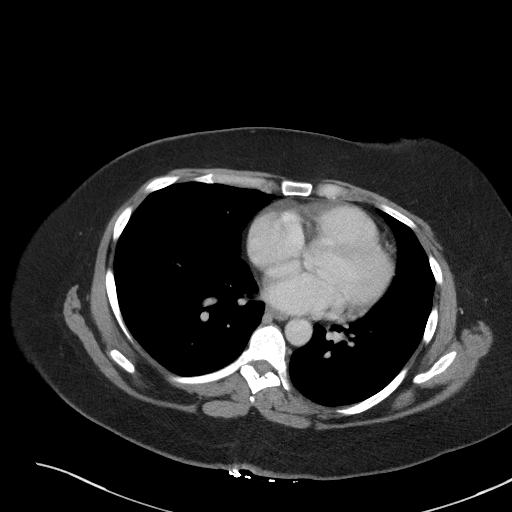

[Series 5: coronal st · coronal · 0.76mm/px · 3 of 101 slices shown]
[im 34/101  soft-tissue]
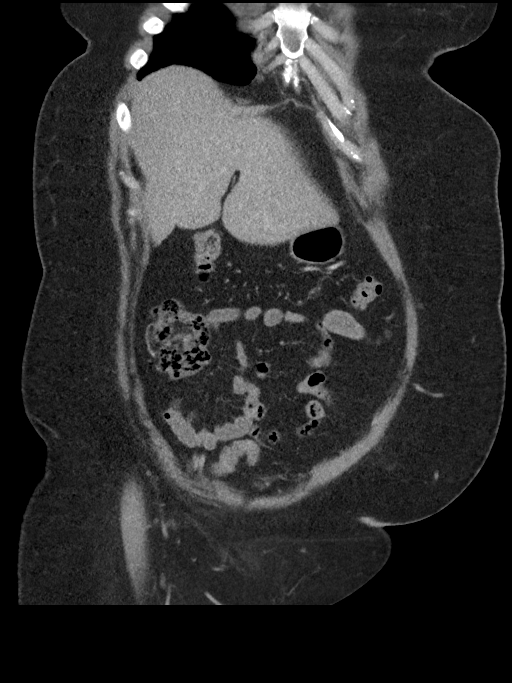
[im 45/101  soft-tissue]
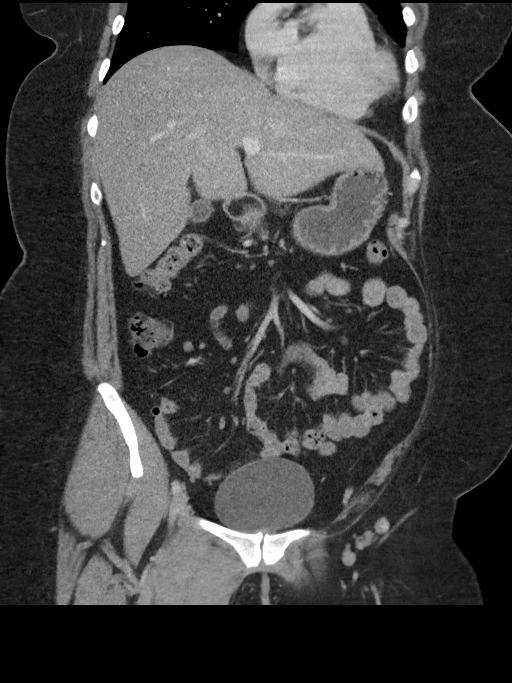
[im 56/101  soft-tissue]
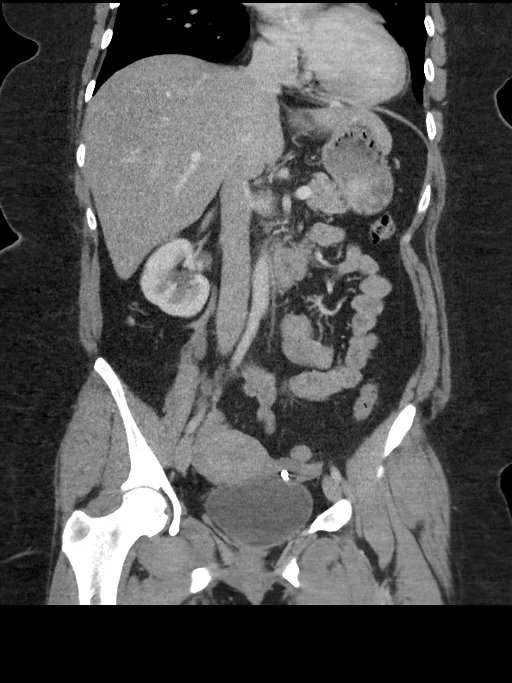

[16 of 46 positions shown; findings below may reference images not displayed]

FINDINGS: Lower chest: No acute abnormality.

Hepatobiliary: Fatty infiltration of the liver is noted. The
gallbladder is decompressed.

Pancreas: Pancreas is within normal limits.

Spleen: Normal in size without focal abnormality.

Adrenals/Urinary Tract: Adrenal glands are unremarkable. Kidneys
demonstrate a normal enhancement pattern bilaterally. Tiny
nonobstructing left renal stone is noted measuring 1 mm. Left
collecting system and ureter are within normal limits. The right
collecting system and ureter are also within normal limits. Bladder
is well distended.

Stomach/Bowel: Scattered minimal diverticular change of the colon is
noted. No diverticulitis is noted. The appendix is well visualized
and within normal limits. Small bowel and stomach are unremarkable.

Vascular/Lymphatic: No significant vascular findings are present. No
enlarged abdominal or pelvic lymph nodes.

Reproductive: Uterus and bilateral adnexa are unremarkable. Changes
of tubal ligation are noted.

Other: No abdominal wall hernia or abnormality. No abdominopelvic
ascites.

Musculoskeletal: Degenerative changes of lumbar spine are seen.
IMPRESSION: Diverticulosis without diverticulitis.

Normal-appearing appendix.

Tiny nonobstructing left renal stone.

Fatty liver.

## 2022-09-24 ENCOUNTER — Encounter: Payer: Self-pay | Admitting: Nurse Practitioner

## 2022-09-24 ENCOUNTER — Ambulatory Visit: Payer: 59 | Admitting: Nurse Practitioner

## 2022-09-24 VITALS — BP 118/80 | HR 70 | Temp 98.2°F | Ht 60.0 in | Wt 214.8 lb

## 2022-09-24 DIAGNOSIS — Z0000 Encounter for general adult medical examination without abnormal findings: Secondary | ICD-10-CM

## 2022-09-24 DIAGNOSIS — N943 Premenstrual tension syndrome: Secondary | ICD-10-CM

## 2022-09-24 DIAGNOSIS — Z23 Encounter for immunization: Secondary | ICD-10-CM

## 2022-09-24 DIAGNOSIS — Z1231 Encounter for screening mammogram for malignant neoplasm of breast: Secondary | ICD-10-CM

## 2022-09-24 DIAGNOSIS — Z6841 Body Mass Index (BMI) 40.0 and over, adult: Secondary | ICD-10-CM

## 2022-09-24 DIAGNOSIS — E782 Mixed hyperlipidemia: Secondary | ICD-10-CM

## 2022-09-24 DIAGNOSIS — Z1329 Encounter for screening for other suspected endocrine disorder: Secondary | ICD-10-CM

## 2022-09-24 NOTE — Progress Notes (Signed)
Meghan Dicker, NP-C Phone: [phone]  Meghan Lewis is a 42 y.o. female who presents today for transfer of care and annual exam. She has no complaints or new concerns today. She is not on any medications. She would like to return to complete fasting lab work.   HYPERLIPIDEMIA Symptoms Chest pain on exertion:  No   Leg claudication:   No Medications: Compliance- None Right upper quadrant pain- No  Muscle aches- No Lipid Panel     Component Value Date/Time   CHOL 161 02/09/2020 1118   TRIG 166.0 (H) 02/09/2020 1118   HDL 48.10 02/09/2020 1118   CHOLHDL 3 02/09/2020 1118   VLDL 33.2 02/09/2020 1118   LDLCALC 80 02/09/2020 1118   Diet: Keto diet- very strict Exercise: Cardio/walking 30 minutes- 1 mile daily Pap smear: 12/11/2020 Colonoscopy: 05/31/2021- 7-10 year recall Mammogram: Never- Due! Family history-  Colon cancer: No  Breast cancer: No  Ovarian cancer: No Menses: LMP- 09/18/2022- regular cycles, denies heavy bleeding, painful cramps Sexually active: Yes Vaccines-   Flu: Never  Tetanus: Due- today!  COVID19: Never HIV screening: Negative Hep C Screening: Negative Tobacco use: No Alcohol use: Yes, 1-2 drinks per week Illicit Drug use: No Dentist: Yes Ophthalmology: Yes   Social History   Tobacco Use  Smoking Status Never  Smokeless Tobacco Never    No current outpatient medications on file prior to visit.   No current facility-administered medications on file prior to visit.    ROS see history of present illness  Objective  Physical Exam Vitals:   09/24/22 1515  BP: 118/80  Pulse: 70  Temp: 98.2 F (36.8 C)  SpO2: 98%    BP Readings from Last 3 Encounters:  09/24/22 118/80  05/31/21 102/61  03/27/21 107/64   Wt Readings from Last 3 Encounters:  09/24/22 214 lb 12.8 oz (97.4 kg)  05/31/21 211 lb (95.7 kg)  03/27/21 223 lb (101.2 kg)    Physical Exam Constitutional:      General: She is not in acute distress.    Appearance:  Normal appearance.  HENT:     Head: Normocephalic.     Right Ear: Tympanic membrane normal.     Left Ear: Tympanic membrane normal.     Nose: Nose normal.     Mouth/Throat:     Mouth: Mucous membranes are moist.     Pharynx: Oropharynx is clear.  Eyes:     Conjunctiva/sclera: Conjunctivae normal.     Pupils: Pupils are equal, round, and reactive to light.  Neck:     Thyroid: No thyromegaly.  Cardiovascular:     Rate and Rhythm: Normal rate and regular rhythm.     Heart sounds: Normal heart sounds.  Pulmonary:     Effort: Pulmonary effort is normal.     Breath sounds: Normal breath sounds.  Abdominal:     General: Abdomen is flat. Bowel sounds are normal.     Palpations: Abdomen is soft. There is no mass.     Tenderness: There is no abdominal tenderness.  Musculoskeletal:        General: Normal range of motion.  Lymphadenopathy:     Cervical: No cervical adenopathy.  Skin:    General: Skin is warm and dry.     Findings: No rash.  Neurological:     General: No focal deficit present.     Mental Status: She is alert.  Psychiatric:        Mood and Affect: Mood normal.  Behavior: Behavior normal.    Assessment/Plan: Please see individual problem list.  Preventative health care Assessment & Plan: Physical exam complete. Lab work as outlined. Patient will return to complete lab work when fasting. Pap- UTD. Mammogram- due, order placed, encouraged patient to call to schedule. Colonoscopy- UTD. Declines flu and COVID vaccines. Tetanus vaccine- given today in office. HIV/Hep C screenings negative. Recommended follow ups with Dentist and Ophthalmology for annual exams. Encouraged to continue healthy diet and exercise. Return to complete fasting labs then in one year, sooner PRN.   Orders: -     CBC with Differential/Platelet; Future -     Comprehensive metabolic panel; Future  Mixed hyperlipidemia Assessment & Plan: Chronic. Stable with diet control. Will check fasting  lipids. Encouraged to continue healthy diet and exercise.   Orders: -     Lipid panel; Future  Morbid obesity with BMI of 40.0-44.9, adult [facility]) Assessment & Plan: Will check A1c. Encouraged to continue healthy diet and exercise.   Orders: -     VITAMIN D 25 Hydroxy (Vit-D Deficiency, Fractures); Future -     Hemoglobin A1c; Future  PMS (premenstrual syndrome) Assessment & Plan: PHQ- 14 and GAD- 2 today. Symptoms are situational and occur prior to starting her menstrual cycle. Declined treatment today. She does not feel that she needs help with her symptoms at this time. Will continue to monitor. Encouraged to contact if worsening symptoms, unusual behavior changes or suicidal thoughts occur.    Thyroid disorder screen -     TSH; Future  Screening mammogram for breast cancer -     3D Screening Mammogram, Left and Right; Future  Need for Tdap vaccination -     Tdap vaccine greater than or equal to 7yo IM   Return in about 1 year (around 09/24/2023) for Annual Exam.   Meghan Dicker, NP-C [facility] - [employer]

## 2022-09-24 NOTE — Assessment & Plan Note (Signed)
Chronic. Stable with diet control. Will check fasting lipids. Encouraged to continue healthy diet and exercise.

## 2022-09-24 NOTE — Assessment & Plan Note (Addendum)
PHQ- 14 and GAD- 2 today. Symptoms are situational and occur prior to starting her menstrual cycle. Declined treatment today. She does not feel that she needs help with her symptoms at this time. Will continue to monitor. Encouraged to contact if worsening symptoms, unusual behavior changes or suicidal thoughts occur.

## 2022-09-24 NOTE — Patient Instructions (Signed)
YOUR MAMMOGRAM IS DUE, PLEASE CALL AND GET THIS SCHEDULED! [facility] - call [phone]    

## 2022-09-24 NOTE — Assessment & Plan Note (Signed)
Will check A1c. Encouraged to continue healthy diet and exercise.  

## 2022-09-24 NOTE — Assessment & Plan Note (Signed)
Physical exam complete. Lab work as outlined. Patient will return to complete lab work when fasting. Pap- UTD. Mammogram- due, order placed, encouraged patient to call to schedule. Colonoscopy- UTD. Declines flu and COVID vaccines. Tetanus vaccine- given today in office. HIV/Hep C screenings negative. Recommended follow ups with Dentist and Ophthalmology for annual exams. Encouraged to continue healthy diet and exercise. Return to complete fasting labs then in one year, sooner PRN.

## 2022-10-01 ENCOUNTER — Other Ambulatory Visit (INDEPENDENT_AMBULATORY_CARE_PROVIDER_SITE_OTHER): Payer: 59

## 2022-10-01 DIAGNOSIS — E782 Mixed hyperlipidemia: Secondary | ICD-10-CM

## 2022-10-01 DIAGNOSIS — Z0000 Encounter for general adult medical examination without abnormal findings: Secondary | ICD-10-CM | POA: Diagnosis not present

## 2022-10-01 DIAGNOSIS — Z1329 Encounter for screening for other suspected endocrine disorder: Secondary | ICD-10-CM

## 2022-10-01 DIAGNOSIS — Z6841 Body Mass Index (BMI) 40.0 and over, adult: Secondary | ICD-10-CM

## 2022-10-01 LAB — CBC WITH DIFFERENTIAL/PLATELET
Basophils Absolute: 0 10*3/uL (ref 0.0–0.1)
Basophils Relative: 0.5 % (ref 0.0–3.0)
Eosinophils Absolute: 0.2 10*3/uL (ref 0.0–0.7)
Eosinophils Relative: 2.7 % (ref 0.0–5.0)
HCT: 36.9 % (ref 36.0–46.0)
Hemoglobin: 12.2 g/dL (ref 12.0–15.0)
Lymphocytes Relative: 26.9 % (ref 12.0–46.0)
Lymphs Abs: 2.2 10*3/uL (ref 0.7–4.0)
MCHC: 33.1 g/dL (ref 30.0–36.0)
MCV: 81.2 fl (ref 78.0–100.0)
Monocytes Absolute: 0.5 10*3/uL (ref 0.1–1.0)
Monocytes Relative: 5.6 % (ref 3.0–12.0)
Neutro Abs: 5.3 10*3/uL (ref 1.4–7.7)
Neutrophils Relative %: 64.3 % (ref 43.0–77.0)
Platelets: 368 10*3/uL (ref 150.0–400.0)
RBC: 4.55 Mil/uL (ref 3.87–5.11)
RDW: 13 % (ref 11.5–15.5)
WBC: 8.3 10*3/uL (ref 4.0–10.5)

## 2022-10-01 LAB — COMPREHENSIVE METABOLIC PANEL
ALT: 12 U/L (ref 0–35)
AST: 13 U/L (ref 0–37)
Albumin: 3.8 g/dL (ref 3.5–5.2)
Alkaline Phosphatase: 56 U/L (ref 39–117)
BUN: 12 mg/dL (ref 6–23)
CO2: 24 mEq/L (ref 19–32)
Calcium: 8.7 mg/dL (ref 8.4–10.5)
Chloride: 102 mEq/L (ref 96–112)
Creatinine, Ser: 0.64 mg/dL (ref 0.40–1.20)
GFR: 110.85 mL/min (ref >60.00)
Glucose, Bld: 87 mg/dL (ref 70–99)
Potassium: 4.1 mEq/L (ref 3.5–5.1)
Sodium: 133 mEq/L — ABNORMAL LOW (ref 135–145)
Total Bilirubin: 0.4 mg/dL (ref 0.2–1.2)
Total Protein: 7.5 g/dL (ref 6.0–8.3)

## 2022-10-01 LAB — LIPID PANEL
Cholesterol: 143 mg/dL (ref 0–200)
HDL: 46.4 mg/dL (ref >39.00)
LDL Cholesterol: 73 mg/dL (ref 0–99)
NonHDL: 96.37
Total CHOL/HDL Ratio: 3
Triglycerides: 115 mg/dL (ref 0.0–149.0)
VLDL: 23 mg/dL (ref 0.0–40.0)

## 2022-10-01 LAB — HEMOGLOBIN A1C: Hgb A1c MFr Bld: 5.5 % (ref 4.6–6.5)

## 2022-10-02 ENCOUNTER — Encounter: Payer: Self-pay | Admitting: Nurse Practitioner

## 2022-10-02 ENCOUNTER — Other Ambulatory Visit: Payer: Self-pay | Admitting: Nurse Practitioner

## 2022-10-02 DIAGNOSIS — E559 Vitamin D deficiency, unspecified: Secondary | ICD-10-CM

## 2022-10-02 LAB — TSH: TSH: 0.94 u[IU]/mL (ref 0.35–5.50)

## 2022-10-02 LAB — VITAMIN D 25 HYDROXY (VIT D DEFICIENCY, FRACTURES): VITD: 15.32 ng/mL — ABNORMAL LOW (ref 30.00–100.00)

## 2022-10-02 MED ORDER — VITAMIN D (ERGOCALCIFEROL) 1.25 MG (50000 UNIT) PO CAPS
50000.0000 [IU] | ORAL_CAPSULE | ORAL | 1 refills | Status: DC
Start: 2022-10-02 — End: 2023-10-14

## 2022-10-10 ENCOUNTER — Ambulatory Visit
Admission: RE | Admit: 2022-10-10 | Discharge: 2022-10-10 | Disposition: A | Discharge status: Home / Self Care | Payer: 59 | Source: Ambulatory Visit | Attending: Nurse Practitioner | Admitting: Nurse Practitioner

## 2022-10-10 DIAGNOSIS — Z1231 Encounter for screening mammogram for malignant neoplasm of breast: Secondary | ICD-10-CM | POA: Diagnosis present

## 2023-08-28 ENCOUNTER — Other Ambulatory Visit: Payer: Self-pay | Admitting: Nurse Practitioner

## 2023-08-28 DIAGNOSIS — Z1231 Encounter for screening mammogram for malignant neoplasm of breast: Secondary | ICD-10-CM

## 2023-09-26 ENCOUNTER — Encounter: Payer: Self-pay | Admitting: Nurse Practitioner

## 2023-09-26 ENCOUNTER — Other Ambulatory Visit: Payer: Self-pay | Admitting: Nurse Practitioner

## 2023-09-26 ENCOUNTER — Ambulatory Visit: Payer: 59 | Admitting: Nurse Practitioner

## 2023-09-26 VITALS — BP 122/72 | HR 76 | Temp 98.3°F | Ht 60.0 in | Wt 215.2 lb

## 2023-09-26 DIAGNOSIS — B372 Candidiasis of skin and nail: Secondary | ICD-10-CM

## 2023-09-26 DIAGNOSIS — E559 Vitamin D deficiency, unspecified: Secondary | ICD-10-CM | POA: Diagnosis not present

## 2023-09-26 DIAGNOSIS — Z1329 Encounter for screening for other suspected endocrine disorder: Secondary | ICD-10-CM

## 2023-09-26 DIAGNOSIS — Z6841 Body Mass Index (BMI) 40.0 and over, adult: Secondary | ICD-10-CM

## 2023-09-26 DIAGNOSIS — E782 Mixed hyperlipidemia: Secondary | ICD-10-CM

## 2023-09-26 DIAGNOSIS — Z0001 Encounter for general adult medical examination with abnormal findings: Secondary | ICD-10-CM

## 2023-09-26 MED ORDER — ZEPBOUND 2.5 MG/0.5ML ~~LOC~~ SOAJ: 2.5000 mg | SUBCUTANEOUS | 0 refills | Status: AC

## 2023-09-26 MED ORDER — NYSTATIN 100000 UNIT/GM EX POWD: 1.0000 | Freq: Three times a day (TID) | CUTANEOUS | 2 refills | Status: AC

## 2023-09-26 MED ORDER — ZEPBOUND 5 MG/0.5ML ~~LOC~~ SOAJ: 5.0000 mg | SUBCUTANEOUS | 0 refills | Status: AC

## 2023-09-26 NOTE — Progress Notes (Signed)
 Bluford Burkitt, NP-C Phone: [phone]  Meghan Lewis is a 43 y.o. female who presents today for annual exam.   Discussed the use of AI scribe software for clinical note transcription with the patient, who gave verbal consent to proceed.  History of Present Illness   Meghan Lewis is a 43 year old female who presents for an annual physical exam and discussion of weight management options.  She previously used tirzepatide  for weight loss, which was effective, resulting in weight loss at a healthy rate. However, she discontinued it due to the high cost and has since regained the weight. The FDA banned the compounding of tirzepatide  as of May 20th, affecting her ability to access it affordably. She has resumed a keto diet, which she previously followed while on tirzepatide , and is making her own sodas to avoid commercial products. She is also increasing her physical activity by walking more and has recently purchased a stand-up desk to help mitigate weight gain associated with her new remote job.  She experienced initial side effects of nausea and vomiting when starting tirzepatide , which resolved after the first day. She tolerated the medication well thereafter without further adverse effects. Since stopping the medication, she has noticed weight gain, which she attributes to lifestyle changes, including a new remote job that has reduced her physical activity.  She reports a rash under her abdominal fold, which is itchy, especially at night, and sometimes burning. She has been applying triple antibiotic ointment, but it persists. She suspects it may be related to sweating.  She mentions experiencing a period of depression after starting her remote job, which led to comfort eating and weight gain. However, she has since established a routine, including going outside during lunch for walks, which has improved her mood. She does not feel she needs further help for depression at this time.  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 No other significant symptoms such as chest pain, shortness of breath, abdominal pain, or changes in bowel habits.      Social History   Tobacco Use  Smoking Status Never  Smokeless Tobacco Never    Current Outpatient Medications on File Prior to Visit  Medication Sig Dispense Refill   Vitamin D , Ergocalciferol , (DRISDOL ) 1.25 MG (50000 UNIT) CAPS capsule Take 1 capsule (50,000 Units total) by mouth every 7 (seven) days. 13 capsule 1   No current facility-administered medications on file prior to visit.     ROS see history of present illness  Objective  Physical Exam Vitals:   09/26/23 1517  BP: 122/72  Pulse: 76  Temp: 98.3 F (36.8 C)  SpO2: 95%    BP Readings from Last 3 Encounters:  09/26/23 122/72  09/24/22 118/80  05/31/21 102/61   Wt Readings from Last 3 Encounters:  09/26/23 215 lb 3.2 oz (97.6 kg)  09/24/22 214 lb 12.8 oz (97.4 kg)  05/31/21 211 lb (95.7 kg)    Physical Exam Constitutional:      General: She is not in acute distress.    Appearance: Normal appearance.  HENT:     Head: Normocephalic.     Right Ear: Tympanic membrane normal.     Left Ear: Tympanic membrane normal.     Nose: Nose normal.     Mouth/Throat:     Mouth: Mucous membranes are moist.     Pharynx: Oropharynx is clear.   Eyes:     Conjunctiva/sclera: Conjunctivae normal.     Pupils:  Pupils are equal, round, and reactive to light.   Neck:     Thyroid: No thyromegaly.   Cardiovascular:     Rate and Rhythm: Normal rate and regular rhythm.     Heart sounds: Normal heart sounds.  Pulmonary:     Effort: Pulmonary effort is normal.     Breath sounds: Normal breath sounds.  Abdominal:     General: Abdomen is flat. Bowel sounds are normal.     Palpations: Abdomen is soft. There is no mass.     Tenderness: There is no abdominal tenderness.   Musculoskeletal:        General: Normal range of motion.  Lymphadenopathy:      Cervical: No cervical adenopathy.   Skin:    General: Skin is warm and dry.     Findings: Rash (under abdominal fold, erythema) present.   Neurological:     General: No focal deficit present.     Mental Status: She is alert.   Psychiatric:        Mood and Affect: Mood normal.        Behavior: Behavior normal.      Assessment/Plan: Please see individual problem list.  Encounter for routine adult health examination with abnormal findings Assessment & Plan: Physical exam complete. She will return to complete fasting lab work. She is up to date on pap smear and tetanus vaccine, with a mammogram scheduled. Politely declines flu and COVID vaccinations. Continue with scheduled mammogram on June 23. Encourage lifestyle modifications, including diet and exercise, for weight management. Continue routine dental and eye exams. Return to care in 6 weeks.   Orders: -     CBC with Differential/Platelet; Future  Morbid obesity with BMI of 40.0-44.9, adult [facility]) Assessment & Plan: Weight gain occurred after stopping tirzepatide  compound. Considering starting Zepbound  or Wegovy based on cost and insurance. Emphasized lifestyle modifications due to remote work. Submit prior authorization for Zepbound  to insurance. Start Zepbound  at 2.5 mg weekly if approved. Counseled on the risk of pancreatitis and gallbladder disease. Discussed the risk of nausea. Advised to discontinue the Zepbound  and contact us  immediately if they develop abdominal pain. If they develop excessive nausea they will contact us  right away. I discussed that medullary thyroid cancer has been seen in rats studies. The patient confirmed no personal or family history of thyroid cancer, parathyroid cancer, or adrenal gland cancer. Discussed that we thus far have not seen medullary thyroid cancer result from use of this type of medication in humans. Advised to monitor the thyroid area and contact us  for any lumps, swelling, trouble  swallowing, or any other changes in this area.  Discussed goal weight loss of 1 to 2 pounds a week while on this medication. Discussed the importance of healthy diet, exercise and lifestyle modifications even with this medication. Schedule follow-up in 6 weeks to assess progress and adjust dosage.  Orders: -     Zepbound ; Inject 2.5 mg into the skin once a week.  Dispense: 2 mL; Refill: 0 -     Zepbound ; Inject 5 mg into the skin once a week.  Dispense: 2 mL; Refill: 0 -     Hemoglobin A1c; Future  Candidal intertrigo Assessment & Plan: Rash under the abdominal fold is likely fungal, mild, itchy, and burning. Prescribe nystatin  powder for the affected area. Encouraged to keep area clean and dry.   Orders: -     Nystatin ; Apply 1 Application topically 3 (three) times daily.  Dispense: 60 g; Refill:  2  Mixed hyperlipidemia -     Comprehensive metabolic panel with GFR; Future -     Lipid panel; Future  Vitamin D  deficiency -     VITAMIN D  25 Hydroxy (Vit-D Deficiency, Fractures); Future  Thyroid disorder screen -     TSH; Future     Return for fasting labs then 6 week follow up.   Bluford Burkitt, NP-C [facility] - [facility]

## 2023-09-29 ENCOUNTER — Telehealth: Payer: Self-pay

## 2023-09-29 ENCOUNTER — Other Ambulatory Visit (HOSPITAL_COMMUNITY): Payer: Self-pay

## 2023-09-29 NOTE — Telephone Encounter (Signed)
 PA needed for zepbound

## 2023-09-29 NOTE — Telephone Encounter (Signed)
 Pharmacy Patient Advocate Encounter   Received notification from CoverMyMeds that prior authorization for Zepbound  2.5MG /0.5ML pen-injectors is required/requested.   Insurance verification completed.   The patient is insured through [employer] .   Per test claim: PA required; PA started via CoverMyMeds. KEY [id number] . Waiting for clinical questions to populate.  This medication may be excluded from the patient's benefit. For more information, I am reaching out to [employer] directly at [phone].

## 2023-09-29 NOTE — Telephone Encounter (Signed)
 This medication may be excluded from the patient's benefit. For more information, I am reaching out to [employer] directly. I will update my encounter and tag the clinical pool once I know in further detail.

## 2023-10-01 ENCOUNTER — Other Ambulatory Visit: Payer: Self-pay | Admitting: Nurse Practitioner

## 2023-10-06 NOTE — Telephone Encounter (Signed)
 Pharmacy Patient Advocate Encounter  Received notification from [facility] that Prior Authorization for Zepbound  2.5MG /0.5ML pen-injectors  has been DENIED.  I called plan and medication is  excluded from the patient's benefit.   Weight loss medication not covered.   PA #/Case ID/Reference #: [id number]

## 2023-10-07 ENCOUNTER — Telehealth: Payer: Self-pay | Admitting: Nurse Practitioner

## 2023-10-07 ENCOUNTER — Other Ambulatory Visit (HOSPITAL_COMMUNITY): Payer: Self-pay

## 2023-10-07 ENCOUNTER — Encounter: Payer: Self-pay | Admitting: Nurse Practitioner

## 2023-10-07 DIAGNOSIS — Z0001 Encounter for general adult medical examination with abnormal findings: Secondary | ICD-10-CM | POA: Insufficient documentation

## 2023-10-07 NOTE — Telephone Encounter (Signed)
 Hey! It was submitted under [employer]. I apologize for the confusion. Our responses are preloaded and I must have selected the wrong one, if you look at my previous note the correct one is selected.      Drug is not covered by plan at all unfortunately.

## 2023-10-07 NOTE — Assessment & Plan Note (Signed)
 Weight gain occurred after stopping tirzepatide  compound. Considering starting Zepbound  or Wegovy based on cost and insurance. Emphasized lifestyle modifications due to remote work. Submit prior authorization for Zepbound  to insurance. Start Zepbound  at 2.5 mg weekly if approved. Counseled on the risk of pancreatitis and gallbladder disease. Discussed the risk of nausea. Advised to discontinue the Zepbound  and contact us  immediately if they develop abdominal pain. If they develop excessive nausea they will contact us  right away. I discussed that medullary thyroid cancer has been seen in rats studies. The patient confirmed no personal or family history of thyroid cancer, parathyroid cancer, or adrenal gland cancer. Discussed that we thus far have not seen medullary thyroid cancer result from use of this type of medication in humans. Advised to monitor the thyroid area and contact us  for any lumps, swelling, trouble swallowing, or any other changes in this area.  Discussed goal weight loss of 1 to 2 pounds a week while on this medication. Discussed the importance of healthy diet, exercise and lifestyle modifications even with this medication. Schedule follow-up in 6 weeks to assess progress and adjust dosage.

## 2023-10-07 NOTE — Telephone Encounter (Signed)
 Patient need labs ordered please

## 2023-10-07 NOTE — Telephone Encounter (Signed)
 Pts insurance is [employer] not [employer] can a PA be doen against her [employer] please for the zepbound 

## 2023-10-07 NOTE — Assessment & Plan Note (Signed)
 Physical exam complete. She will return to complete fasting lab work. She is up to date on pap smear and tetanus vaccine, with a mammogram scheduled. Politely declines flu and COVID vaccinations. Continue with scheduled mammogram on June 23. Encourage lifestyle modifications, including diet and exercise, for weight management. Continue routine dental and eye exams. Return to care in 6 weeks.

## 2023-10-07 NOTE — Assessment & Plan Note (Signed)
 Rash under the abdominal fold is likely fungal, mild, itchy, and burning. Prescribe nystatin  powder for the affected area. Encouraged to keep area clean and dry.

## 2023-10-13 ENCOUNTER — Other Ambulatory Visit (INDEPENDENT_AMBULATORY_CARE_PROVIDER_SITE_OTHER)

## 2023-10-13 ENCOUNTER — Ambulatory Visit
Admission: RE | Admit: 2023-10-13 | Discharge: 2023-10-13 | Disposition: A | Discharge status: Home / Self Care | Source: Ambulatory Visit | Attending: Nurse Practitioner | Admitting: Nurse Practitioner

## 2023-10-13 ENCOUNTER — Encounter: Payer: Self-pay | Admitting: Nurse Practitioner

## 2023-10-13 DIAGNOSIS — Z1231 Encounter for screening mammogram for malignant neoplasm of breast: Secondary | ICD-10-CM | POA: Diagnosis present

## 2023-10-13 DIAGNOSIS — E559 Vitamin D deficiency, unspecified: Secondary | ICD-10-CM | POA: Diagnosis not present

## 2023-10-13 DIAGNOSIS — Z0000 Encounter for general adult medical examination without abnormal findings: Secondary | ICD-10-CM | POA: Diagnosis not present

## 2023-10-13 DIAGNOSIS — Z0001 Encounter for general adult medical examination with abnormal findings: Secondary | ICD-10-CM

## 2023-10-13 DIAGNOSIS — Z1329 Encounter for screening for other suspected endocrine disorder: Secondary | ICD-10-CM | POA: Diagnosis not present

## 2023-10-13 DIAGNOSIS — E782 Mixed hyperlipidemia: Secondary | ICD-10-CM | POA: Diagnosis not present

## 2023-10-13 DIAGNOSIS — Z6841 Body Mass Index (BMI) 40.0 and over, adult: Secondary | ICD-10-CM

## 2023-10-13 LAB — HEMOGLOBIN A1C: Hgb A1c MFr Bld: 5.7 % (ref 4.6–6.5)

## 2023-10-13 LAB — COMPREHENSIVE METABOLIC PANEL WITH GFR
ALT: 12 U/L (ref 0–35)
AST: 13 U/L (ref 0–37)
Albumin: 3.7 g/dL (ref 3.5–5.2)
Alkaline Phosphatase: 62 U/L (ref 39–117)
BUN: 9 mg/dL (ref 6–23)
CO2: 28 meq/L (ref 19–32)
Calcium: 8.3 mg/dL — ABNORMAL LOW (ref 8.4–10.5)
Chloride: 103 meq/L (ref 96–112)
Creatinine, Ser: 0.7 mg/dL (ref 0.40–1.20)
GFR: 107.7 mL/min (ref >60.00)
Glucose, Bld: 88 mg/dL (ref 70–99)
Potassium: 4.1 meq/L (ref 3.5–5.1)
Sodium: 136 meq/L (ref 135–145)
Total Bilirubin: 0.2 mg/dL (ref 0.2–1.2)
Total Protein: 7.2 g/dL (ref 6.0–8.3)

## 2023-10-13 LAB — LIPID PANEL
Cholesterol: 148 mg/dL (ref 0–200)
HDL: 43 mg/dL (ref >39.00)
LDL Cholesterol: 64 mg/dL (ref 0–99)
NonHDL: 105.31
Total CHOL/HDL Ratio: 3
Triglycerides: 209 mg/dL — ABNORMAL HIGH (ref 0.0–149.0)
VLDL: 41.8 mg/dL — ABNORMAL HIGH (ref 0.0–40.0)

## 2023-10-13 LAB — CBC WITH DIFFERENTIAL/PLATELET
Basophils Absolute: 0.1 10*3/uL (ref 0.0–0.1)
Basophils Relative: 0.6 % (ref 0.0–3.0)
Eosinophils Absolute: 0.4 10*3/uL (ref 0.0–0.7)
Eosinophils Relative: 4.6 % (ref 0.0–5.0)
HCT: 34.6 % — ABNORMAL LOW (ref 36.0–46.0)
Hemoglobin: 11.7 g/dL — ABNORMAL LOW (ref 12.0–15.0)
Lymphocytes Relative: 28.4 % (ref 12.0–46.0)
Lymphs Abs: 2.5 10*3/uL (ref 0.7–4.0)
MCHC: 33.9 g/dL (ref 30.0–36.0)
MCV: 81.1 fl (ref 78.0–100.0)
Monocytes Absolute: 0.4 10*3/uL (ref 0.1–1.0)
Monocytes Relative: 4.6 % (ref 3.0–12.0)
Neutro Abs: 5.5 10*3/uL (ref 1.4–7.7)
Neutrophils Relative %: 61.8 % (ref 43.0–77.0)
Platelets: 384 10*3/uL (ref 150.0–400.0)
RBC: 4.27 Mil/uL (ref 3.87–5.11)
RDW: 13.2 % (ref 11.5–15.5)
WBC: 8.9 10*3/uL (ref 4.0–10.5)

## 2023-10-13 LAB — TSH: TSH: 1.2 u[IU]/mL (ref 0.35–5.50)

## 2023-10-13 LAB — VITAMIN D 25 HYDROXY (VIT D DEFICIENCY, FRACTURES): VITD: 13.86 ng/mL — ABNORMAL LOW (ref 30.00–100.00)

## 2023-10-14 ENCOUNTER — Ambulatory Visit: Payer: Self-pay | Admitting: Nurse Practitioner

## 2023-10-14 DIAGNOSIS — E559 Vitamin D deficiency, unspecified: Secondary | ICD-10-CM | POA: Insufficient documentation

## 2023-10-14 DIAGNOSIS — D649 Anemia, unspecified: Secondary | ICD-10-CM

## 2023-10-14 MED ORDER — VITAMIN D (ERGOCALCIFEROL) 1.25 MG (50000 UNIT) PO CAPS: 50000.0000 [IU] | ORAL_CAPSULE | ORAL | 1 refills | Status: AC

## 2023-10-15 ENCOUNTER — Ambulatory Visit: Payer: Self-pay | Admitting: Nurse Practitioner

## 2023-11-07 ENCOUNTER — Ambulatory Visit: Admitting: Nurse Practitioner
